# Patient Record
Sex: Male | Born: 1960 | Race: White | Hispanic: No | Marital: Married | Smoking: Never smoker
Health system: Southern US, Community
[De-identification: ages and names within clinical notes are randomized; demographics above are authoritative.]

## PROBLEM LIST (undated history)

## (undated) DIAGNOSIS — R7982 Elevated C-reactive protein (CRP): Secondary | ICD-10-CM

## (undated) DIAGNOSIS — I10 Essential (primary) hypertension: Secondary | ICD-10-CM

## (undated) DIAGNOSIS — N529 Male erectile dysfunction, unspecified: Secondary | ICD-10-CM

## (undated) DIAGNOSIS — R972 Elevated prostate specific antigen [PSA]: Secondary | ICD-10-CM

## (undated) HISTORY — DX: Male erectile dysfunction, unspecified: N52.9

## (undated) HISTORY — DX: Elevated C-reactive protein (CRP): R79.82

## (undated) HISTORY — DX: Elevated prostate specific antigen (PSA): R97.20

## (undated) HISTORY — DX: Essential (primary) hypertension: I10

## (undated) HISTORY — PX: HERNIA REPAIR: SHX51

---

## 1995-07-23 HISTORY — PX: VASECTOMY: SHX75

## 2005-09-04 ENCOUNTER — Ambulatory Visit: Payer: Self-pay | Admitting: Pulmonary Disease

## 2006-12-25 DIAGNOSIS — N400 Enlarged prostate without lower urinary tract symptoms: Secondary | ICD-10-CM | POA: Insufficient documentation

## 2008-09-09 DIAGNOSIS — I10 Essential (primary) hypertension: Secondary | ICD-10-CM | POA: Insufficient documentation

## 2008-09-18 DIAGNOSIS — R7982 Elevated C-reactive protein (CRP): Secondary | ICD-10-CM

## 2008-09-18 DIAGNOSIS — R972 Elevated prostate specific antigen [PSA]: Secondary | ICD-10-CM

## 2008-09-18 HISTORY — DX: Elevated prostate specific antigen (PSA): R97.20

## 2008-09-18 HISTORY — DX: Elevated C-reactive protein (CRP): R79.82

## 2010-05-26 ENCOUNTER — Inpatient Hospital Stay: Payer: Self-pay | Admitting: Surgery

## 2010-05-26 HISTORY — PX: APPENDECTOMY: SHX54

## 2010-05-29 LAB — PATHOLOGY REPORT

## 2010-07-22 HISTORY — PX: CHOLECYSTECTOMY: SHX55

## 2011-04-15 ENCOUNTER — Observation Stay: Payer: Self-pay | Admitting: Surgery

## 2013-03-16 LAB — HM HEPATITIS C SCREENING LAB: HM Hepatitis Screen: NEGATIVE

## 2013-07-23 ENCOUNTER — Ambulatory Visit: Payer: Self-pay | Admitting: Bariatrics

## 2013-07-23 DIAGNOSIS — I10 Essential (primary) hypertension: Secondary | ICD-10-CM

## 2013-07-23 LAB — IRON AND TIBC
Iron Bind.Cap.(Total): 302 ug/dL (ref 250–450)
Iron Saturation: 24 %
Iron: 73 ug/dL (ref 65–175)
Unbound Iron-Bind.Cap.: 229 ug/dL

## 2013-07-23 LAB — PROTIME-INR
INR: 1
PROTHROMBIN TIME: 13 s (ref 11.5–14.7)

## 2013-07-23 LAB — APTT: Activated PTT: 30.6 secs (ref 23.6–35.9)

## 2013-07-23 LAB — HEMOGLOBIN A1C: HEMOGLOBIN A1C: 5.4 % (ref 4.2–6.3)

## 2013-07-26 ENCOUNTER — Ambulatory Visit: Payer: Self-pay | Admitting: Bariatrics

## 2013-08-22 ENCOUNTER — Ambulatory Visit: Payer: Self-pay | Admitting: Bariatrics

## 2013-12-20 HISTORY — PX: OTHER SURGICAL HISTORY: SHX169

## 2013-12-30 ENCOUNTER — Ambulatory Visit: Payer: Self-pay | Admitting: Bariatrics

## 2013-12-30 LAB — POTASSIUM: Potassium: 3.6 mmol/L (ref 3.5–5.1)

## 2014-01-04 ENCOUNTER — Inpatient Hospital Stay: Payer: Self-pay | Admitting: Bariatrics

## 2014-01-04 LAB — CREATININE, SERUM
Creatinine: 0.99 mg/dL (ref 0.60–1.30)
EGFR (African American): >60

## 2014-01-05 LAB — CBC WITH DIFFERENTIAL/PLATELET
BASOS ABS: 0 10*3/uL (ref 0.0–0.1)
Basophil %: 0.1 %
EOS ABS: 0 10*3/uL (ref 0.0–0.7)
Eosinophil %: 0 %
HCT: 39.3 % — ABNORMAL LOW (ref 40.0–52.0)
HGB: 13.6 g/dL (ref 13.0–18.0)
Lymphocyte #: 1.3 10*3/uL (ref 1.0–3.6)
Lymphocyte %: 8.4 %
MCH: 28.8 pg (ref 26.0–34.0)
MCHC: 34.6 g/dL (ref 32.0–36.0)
MCV: 83 fL (ref 80–100)
Monocyte #: 0.8 x10 3/mm (ref 0.2–1.0)
Monocyte %: 5.2 %
Neutrophil #: 12.9 10*3/uL — ABNORMAL HIGH (ref 1.4–6.5)
Neutrophil %: 86.3 %
Platelet: 176 10*3/uL (ref 150–440)
RBC: 4.73 10*6/uL (ref 4.40–5.90)
RDW: 14.5 % (ref 11.5–14.5)
WBC: 15 10*3/uL — AB (ref 3.8–10.6)

## 2014-01-05 LAB — BASIC METABOLIC PANEL
ANION GAP: 9 (ref 7–16)
BUN: 9 mg/dL (ref 7–18)
CHLORIDE: 104 mmol/L (ref 98–107)
CO2: 26 mmol/L (ref 21–32)
Calcium, Total: 8.2 mg/dL — ABNORMAL LOW (ref 8.5–10.1)
Creatinine: 0.82 mg/dL (ref 0.60–1.30)
EGFR (African American): >60
EGFR (Non-African Amer.): >60
GLUCOSE: 101 mg/dL — AB (ref 65–99)
Osmolality: 276 (ref 275–301)
Potassium: 3.9 mmol/L (ref 3.5–5.1)
Sodium: 139 mmol/L (ref 136–145)

## 2014-01-05 LAB — PATHOLOGY REPORT

## 2014-03-18 LAB — LIPID PANEL
Cholesterol: 146 mg/dL (ref 0–200)
HDL: 36 mg/dL (ref 35–70)
LDL CALC: 80 mg/dL
TRIGLYCERIDES: 148 mg/dL (ref 40–160)

## 2014-03-18 LAB — BASIC METABOLIC PANEL
BUN: 11 mg/dL (ref 4–21)
Creatinine: 1 mg/dL (ref 0.6–1.3)
GLUCOSE: 93 mg/dL
Potassium: 3.9 mmol/L (ref 3.4–5.3)
SODIUM: 143 mmol/L (ref 137–147)

## 2014-06-28 LAB — HM COLONOSCOPY

## 2014-07-11 LAB — PSA: PSA: 5.7

## 2014-11-12 NOTE — Op Note (Signed)
PATIENT NAME:  John Duran, John Duran MR#:  [id number] DATE OF BIRTH:  1961/02/27  DATE OF PROCEDURE:  01/04/2014  PROCEDURE PERFORMED:  Laparoscopic sleeve gastrectomy with repair of hiatal hernia.   PREOPERATIVE DIAGNOSIS:  Long-standing morbid obesity with presence of hiatal hernia.  POSTOPERATIVE DIAGNOSIS:  Long-standing morbid obesity with presence of hiatal hernia.  SPECIMEN TYPE:  Lateral stomach.   SURGEON:  Effie ShyMichael Tyner, M.D.   ASSISTANT:  Anabel Halonon Drinkwater, PA.   ESTIMATED BLOOD LOSS:   Minimal blood loss noted intraoperatively.   PROCEDURE IN DETAIL: The patient was brought to the operating room and placed in the supine position. General anesthesia was obtained with orotracheal intubation. The abdomen, groin and perineal areas sterilely prepped and draped. TED hose and Thromboguards and a foot board also applied at the end of the operative bed prior to the drape. The patient had a 5 mm Optiview trocar introduced under direct visualization in the left upper quadrant of the abdomen. Three additional trocars were introduced along the upper abdomen. A Nathanson liver retractor introduced through a subxiphoid defect. The left lobe of the liver was elevated. A modestly prominent fat pad released from the undersurface of the left inguinal diaphragm and also the ligamentous attachments of the fundus to the undersurface of the left hemidiaphragm divided.  The patient was noted to have a moderate indentation in the region of the hiatus consistent with a hiatal hernia as noted on preoperative upper GI series. The patient had repair of this by release of the gastrohepatic ligament, followed by division of the residual peritoneum across the anterior hiatus. Blunt dissection was used to mobilize the herniated peritoneum and also the esophagus and anterior vagal nerve away from the pericardium. The patient then had division of the peritoneum just lateral to the right crural margin and blunt dissection was used to  reduce herniated lesser sac fatty tissue from the lower mediastinum. The patient then had release of residual phrenoesophageal ligaments, followed by additional circumferential dissection of the lower esophagus within the lower mediastinum. This was performed circumferentially with division of lymphovascular attachments between the esophagus and the underlying aorta, the pleural surfaces and the pericardium. This was extended into the lower mediastinum over a distance of approximately 6 cm. This resulted in delivery of 1.5 cm of esophagus lying comfortably in the abdominal cavity. Two interrupted 0 Ethibond sutures then used to close the crural musculature posteriorly. Next, the pylorus was identified and beginning approximately 4 cm proximal to the pylorus, there was division of vascular pedicles along the greater curvature of the stomach. This was extended proximally over a distance of approximately 8 cm. Next, posterior attachments of the stomach and pancreas divided by use of the Harmonic scalpel. At this point, a green load GIA stapler was used to initiate creation of a medially based gastric tube effect. The first firing was placed in a transverse direction with care taken to avoid any narrowing in the region of the incisura. A gold load staple was after this, and again, this was directed in a transverse direction avoiding the region of the incisura. This was then followed by introduction of a 5436 French ViSiGi device, which was positioned in the antral aspect of the stomach. This was then followed by a more vertical line of staples placed parallel to the lesser curvature and brought out ultimately just lateral to the angle of His. As this was done,  the ViSiGi device was partially withdrawn by trying to create a consistent tubular affect as the staples  were being positioned. The patient then had release of the lateral vascular pedicles along the upper greater curvature of the stomach with complete division of  the gastrocolic and gastrosplenic ligaments.  The lateral stomach was retrieved through the right upper quadrant 12 mm trocar site. The patient then had occlusion of the stomach just distal to staple line and insufflation with the ViSiGi device performed. A saline bath performed. No air leak identified. The ViSiGi was withdrawn. The upper aspect of the stomach, in particular near the angle of His, was affixed to the crural musculature with interrupted 0 Ethibond sutures. This was done in an effort to prevent proximal migration of the stomach back into the lower mediastinum. At this point, the 12 mm trocar was withdrawn, the fascia and peritoneum closed with a 0 Vicryl suture passed by way of a needle suture system under direct visualization.  The pneumoperitoneum relieved. The trocars were removed. The wounds were injected with 0.25% Marcaine and closed with 4-0 Monocryl in the dermis followed by Dermabond. The patient was allowed to recover having tolerated the procedure well.     ____________________________ Tyrone Apple. Alva Garnet, MD mat:dmm D: 01/04/2014 19:29:36 ET T: 01/04/2014 20:39:04 ET JOB#: [id number]  cc: Casimiro Needle A. Alva Garnet, MD, <Dictator> Everette Rank MD ELECTRONICALLY SIGNED 01/05/2014 7:27

## 2015-01-02 ENCOUNTER — Other Ambulatory Visit: Payer: Self-pay | Admitting: Family Medicine

## 2015-02-15 ENCOUNTER — Other Ambulatory Visit: Payer: Self-pay | Admitting: Family Medicine

## 2015-02-16 ENCOUNTER — Other Ambulatory Visit: Payer: Self-pay | Admitting: Family Medicine

## 2015-02-17 NOTE — Telephone Encounter (Signed)
Rx called in to pharmacy. 

## 2015-03-14 DIAGNOSIS — G4733 Obstructive sleep apnea (adult) (pediatric): Secondary | ICD-10-CM | POA: Insufficient documentation

## 2015-03-14 DIAGNOSIS — N50819 Testicular pain, unspecified: Secondary | ICD-10-CM | POA: Insufficient documentation

## 2015-03-14 DIAGNOSIS — M21249 Flexion deformity, unspecified finger joints: Secondary | ICD-10-CM | POA: Insufficient documentation

## 2015-03-14 DIAGNOSIS — N529 Male erectile dysfunction, unspecified: Secondary | ICD-10-CM | POA: Insufficient documentation

## 2015-03-14 DIAGNOSIS — K4090 Unilateral inguinal hernia, without obstruction or gangrene, not specified as recurrent: Secondary | ICD-10-CM | POA: Insufficient documentation

## 2015-03-15 ENCOUNTER — Ambulatory Visit (INDEPENDENT_AMBULATORY_CARE_PROVIDER_SITE_OTHER): Payer: Commercial Managed Care - PPO | Admitting: Family Medicine

## 2015-03-15 ENCOUNTER — Encounter: Payer: Self-pay | Admitting: Family Medicine

## 2015-03-15 VITALS — BP 114/64 | HR 65 | Temp 98.9°F | Resp 16 | Wt 223.0 lb

## 2015-03-15 DIAGNOSIS — R972 Elevated prostate specific antigen [PSA]: Secondary | ICD-10-CM

## 2015-03-15 DIAGNOSIS — I10 Essential (primary) hypertension: Secondary | ICD-10-CM

## 2015-03-15 DIAGNOSIS — E291 Testicular hypofunction: Secondary | ICD-10-CM

## 2015-03-15 NOTE — Progress Notes (Signed)
Patient: John Duran Male    DOB: [birth year]   54 y.o.   MRN: [id number] Visit Date: 03/15/2015  Today's Provider: Mila Merry, MD   Chief Complaint  Patient presents with  . Follow-up    4 month  . Hypertension  . Erectile Dysfunction  . Benign Prostatic Hypertrophy   Subjective:    HPI   Hypertension, follow-up:  BP Readings from Last 3 Encounters:  03/15/15 114/64  11/10/14 118/68    He was last seen for hypertension 4 months ago.  BP at that visit was 118/68. Management changes since that visit include; decreased  Amlodipine 5 mg  to 2.5 mg qd due to well controlled blood pressure.  He reports fair compliance with treatment. He is not having side effects. none  He is exercising. He is not adherent to low salt diet.   Outside blood pressures are none. He is experiencing none.  Patient denies none.   Cardiovascular risk factors include none.  Use of agents associated with hypertension: none.   He is maintaining his weight about 50 pounds under his pre-gastric sleeve weight.   Weight trend: stable Wt Readings from Last 3 Encounters:  03/15/15 223 lb (101.152 kg)  11/10/14 219 lb (99.338 kg)    Current diet: well balanced  ------------------------------------------------------------------------   Low testosterone  Is doing well with current dose of Androgel. He feels it is really helping with his energy level and libido. last testosterone level a year ago was normal at 557    Allergies  Allergen Reactions  . Androderm [Testosterone]     Androderm (12/25/2006   Previous Medications   AMLODIPINE (NORVASC) 2.5 MG TABLET    Take 2.5 mg by mouth daily.   ANDROGEL PUMP 20.25 MG/ACT (1.62%) GEL    APPLY 2 PUMPS EVERY DAY AS DIRECTED   CIALIS 5 MG TABLET    Take 1 tablet by mouth daily.   LISINOPRIL-HYDROCHLOROTHIAZIDE (PRINZIDE,ZESTORETIC) 10-12.5 MG PER TABLET    TAKE 1 TABLET BY MOUTH EVERY DAY   LORATADINE 10 MG CAPS    Take 1 capsule by  mouth daily.   OMEGA-3 FATTY ACIDS PO    Take 2 capsules by mouth daily.   TADALAFIL (CIALIS) 20 MG TABLET    Take 1 tablet by mouth as needed.    Review of Systems  Cardiovascular: Negative for chest pain, palpitations and leg swelling.  Neurological: Negative for dizziness, light-headedness and headaches.    Social History  Substance Use Topics  . Smoking status: Never Smoker   . Smokeless tobacco: Not on file  . Alcohol Use: No   Objective:   BP 114/64 mmHg  Pulse 65  Temp(Src) 98.9 F (37.2 C) (Oral)  Resp 16  Wt 223 lb (101.152 kg)  SpO2 97%  Physical Exam General Appearance:    Alert, cooperative, no distress, obese  Eyes:    PERRL, conjunctiva/corneas clear, EOM's intact       Lungs:     Clear to auscultation bilaterally, respirations unlabored  Heart:    Regular rate and rhythm  Neurologic:   Awake, alert, oriented x 3. No apparent focal neurological           defect.           Assessment & Plan:      1. Essential hypertension Well controlled. Will discontinue amlodipine. Advised to call if he starts seeing BPs above 140.   2. Testicular hypofunction Feels well on current dose  of androgel with no adverse effects.  - Testosterone,Free and Total  3. Abnormal prostate specific antigen Has had thorough urology evaluation. Will continue to monitor as he has no urology follow up scheduled.  - PSA       Mila Merry, MD  [facility]

## 2015-03-16 LAB — TESTOSTERONE,FREE AND TOTAL
Testosterone, Free: 11.4 pg/mL (ref 7.2–24.0)
Testosterone: 513 ng/dL (ref 348–1197)

## 2015-03-16 LAB — PSA: Prostate Specific Ag, Serum: 5.7 ng/mL — ABNORMAL HIGH (ref 0.0–4.0)

## 2015-03-19 ENCOUNTER — Other Ambulatory Visit: Payer: Self-pay | Admitting: Family Medicine

## 2015-03-20 ENCOUNTER — Encounter: Payer: Self-pay | Admitting: Family Medicine

## 2015-05-06 ENCOUNTER — Ambulatory Visit (INDEPENDENT_AMBULATORY_CARE_PROVIDER_SITE_OTHER): Payer: Commercial Managed Care - PPO

## 2015-05-06 DIAGNOSIS — Z23 Encounter for immunization: Secondary | ICD-10-CM

## 2015-05-13 IMAGING — RF DG UGI W/O KUB
9 of 10 series · 15 of 19 positions shown · non-contrast
Comparison: None.

FLUOROSCOPY TIME:  1 min 18 seconds

CLINICAL DATA: Preoperative evaluation for bariatric surgery.

EXAM:
UPPER GI SERIES WITHOUT KUB
TECHNIQUE: Routine upper GI series was performed with thin and high density
barium.

[Series 1: fluoro_barium 2fps_bw · 0.17mm/px · 2 of 2 frames shown (1 of 8)]
[frame 1/2]
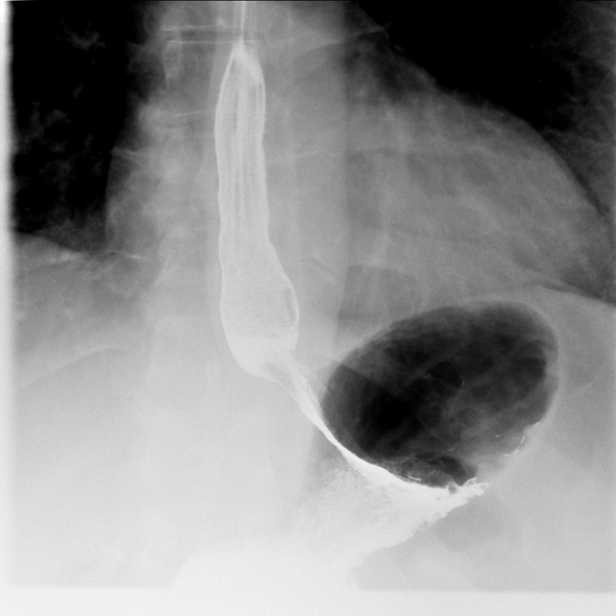
[frame 2/2]
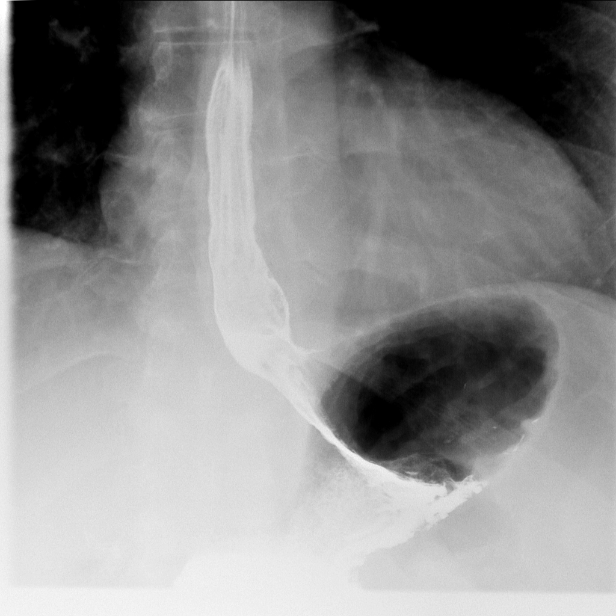

[Series 2: fluoro_barium 2fps_bw · 0.17mm/px · 1 of 2 frames shown (2 of 8)]
[frame 2/2]
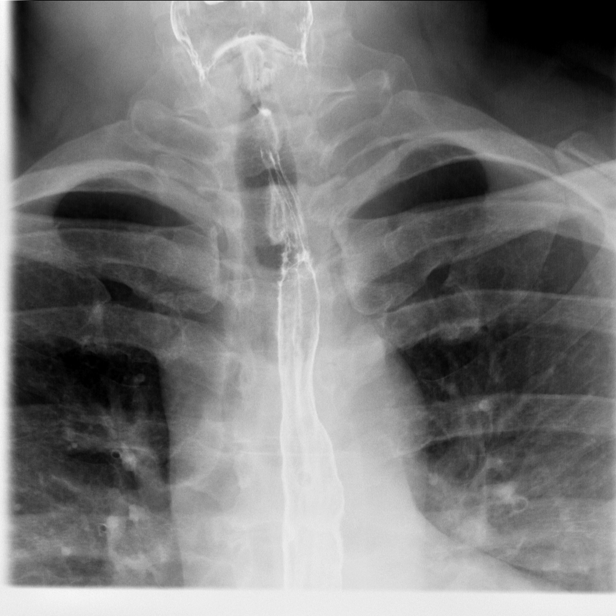

[Series 3: fluoro_barium 2fps_bw · 0.17mm/px · 2 of 2 frames shown (3 of 8)]
[frame 1/2]
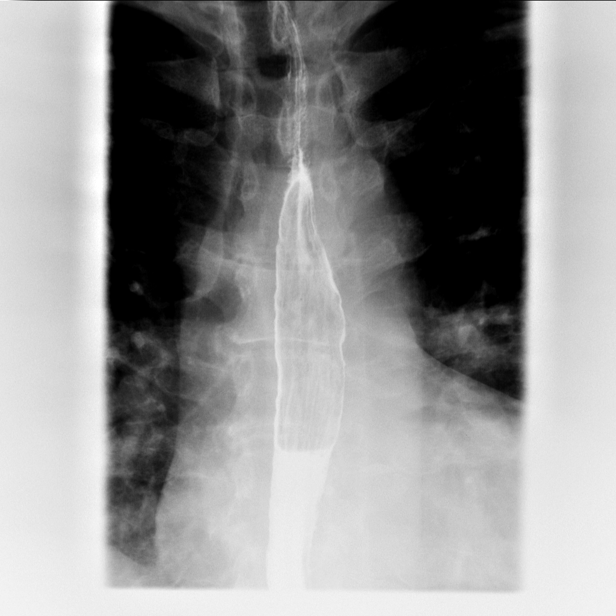
[frame 2/2]
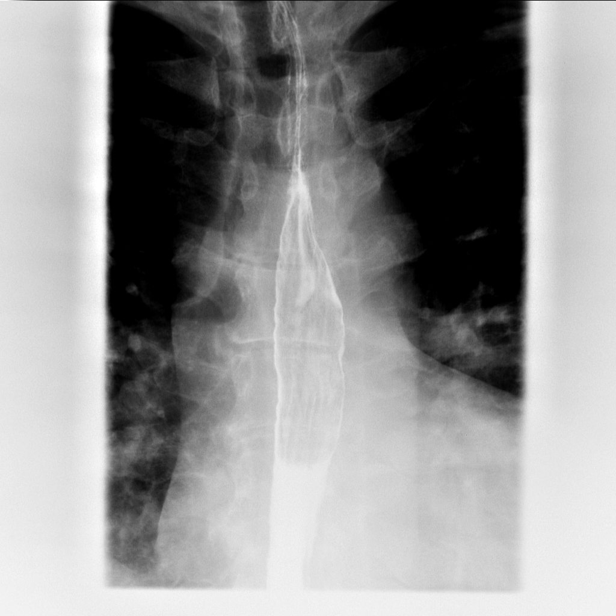

[Series 4: fluoro_barium 2fps_bw · 0.20mm/px · 2 of 3 frames shown (4 of 8)]
[frame 1/3]
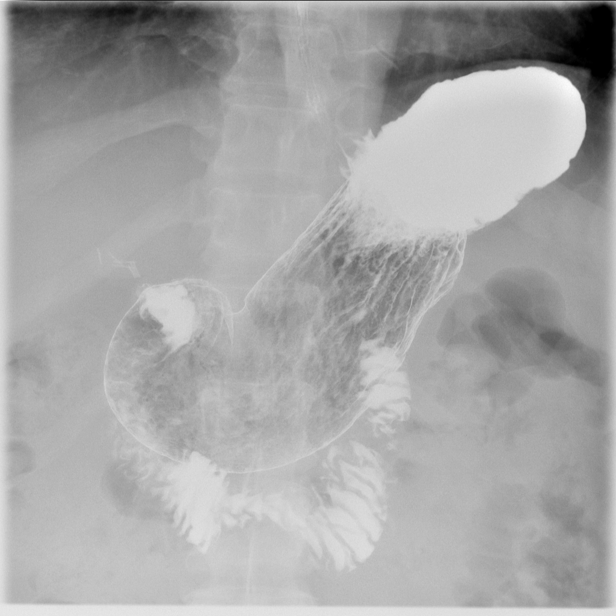
[frame 3/3]
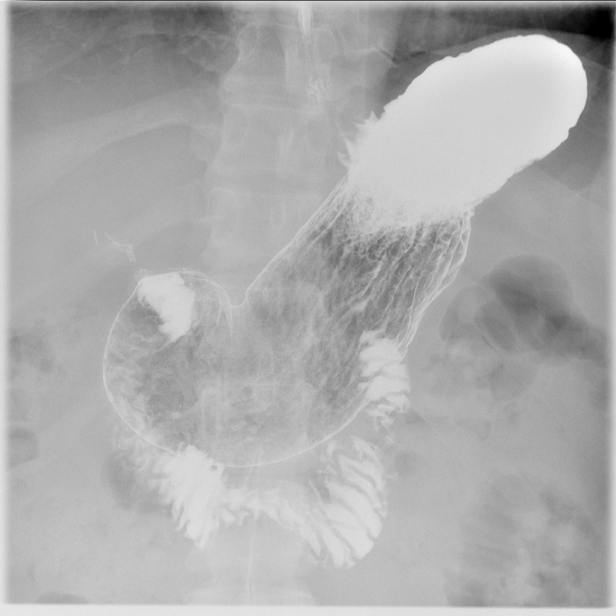

[Series 5: fluoro_barium 2fps_bw · 0.20mm/px · 1 of 1 slices shown (5 of 8)]
[im 1/1]
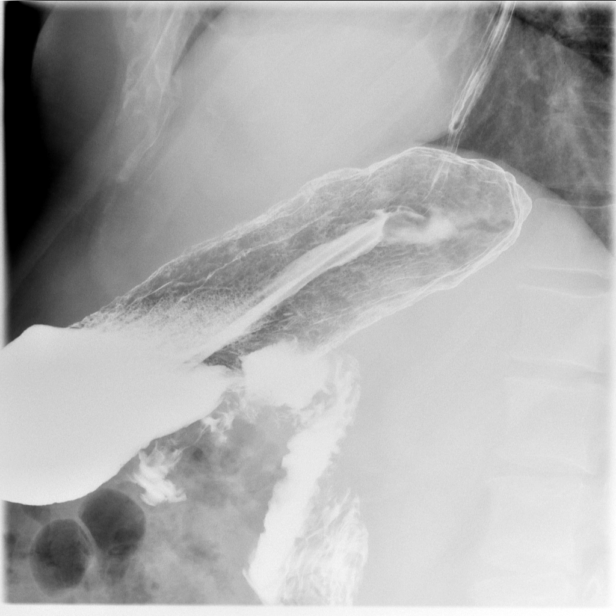

[Series 6: fluoro_barium 2fps_bw · 0.20mm/px · 1 of 1 slices shown (6 of 8)]
[im 1/1]
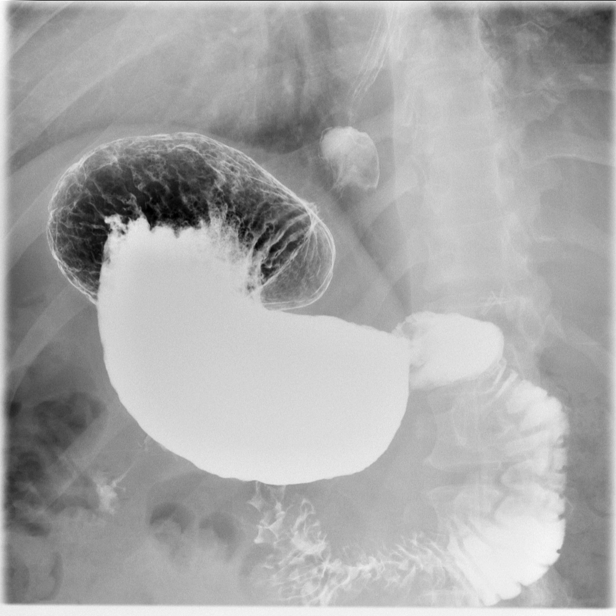

[Series 7: cp_standard · 0.20mm/px · 4 of 5 slices shown]
[im 2/5]
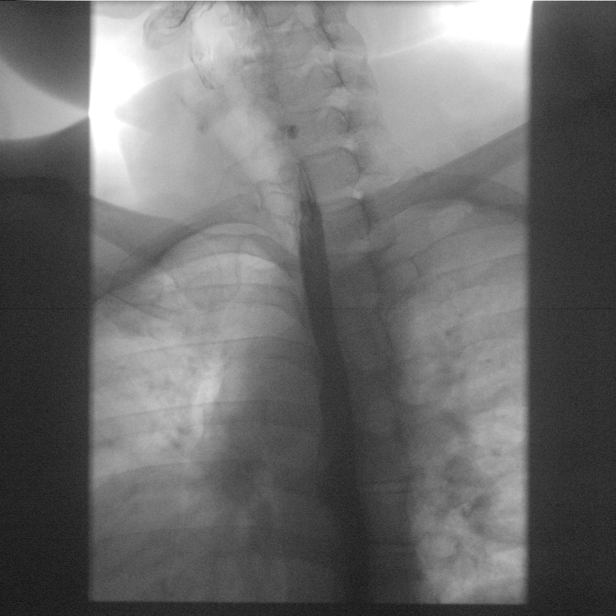
[im 3/5]
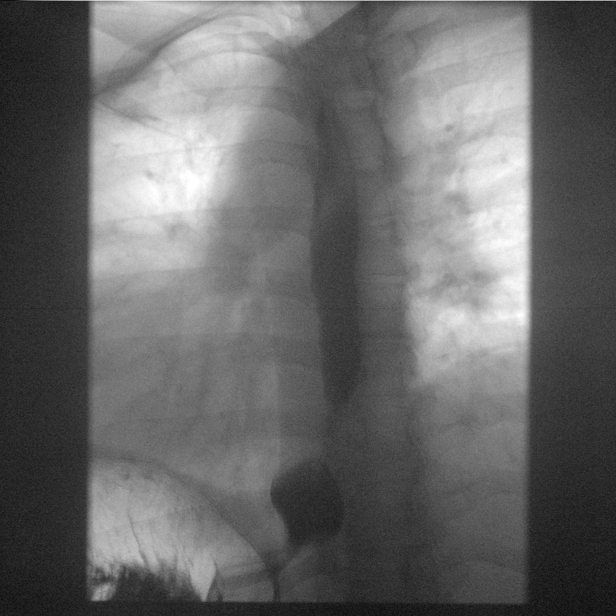
[im 4/5]
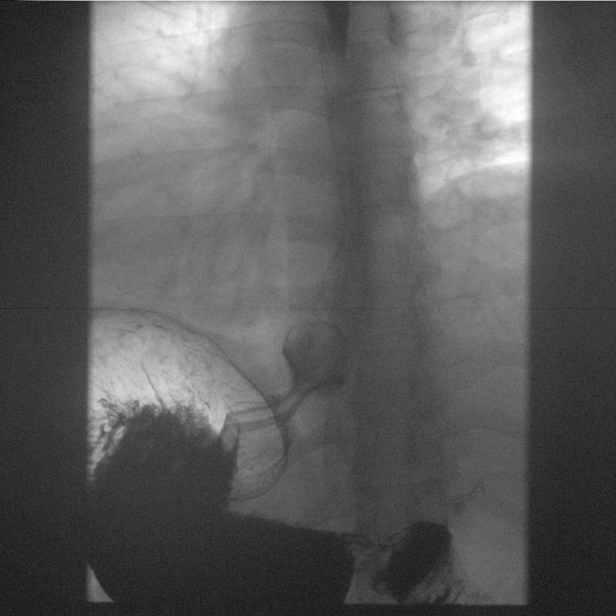
[im 5/5]
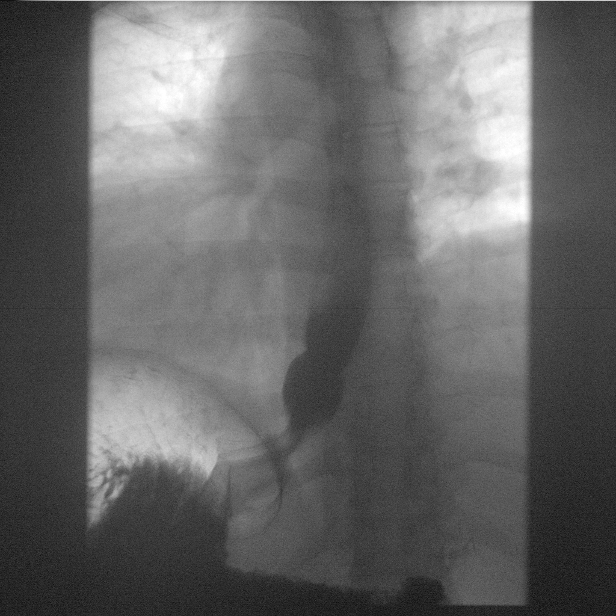

[Series 9: fluoro_barium 2fps_bw · 0.20mm/px · 1 of 1 slices shown (7 of 8)]
[im 1/1]
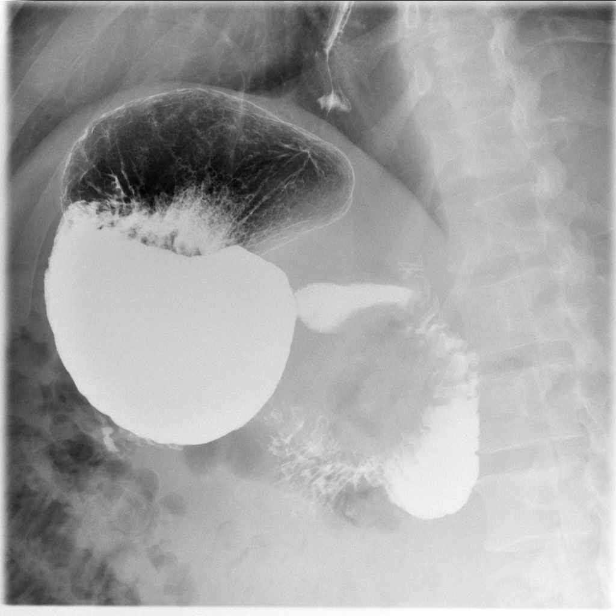

[Series 10: fluoro_barium 2fps_bw · 0.21mm/px · 1 of 1 slices shown (8 of 8)]
[im 1/1]
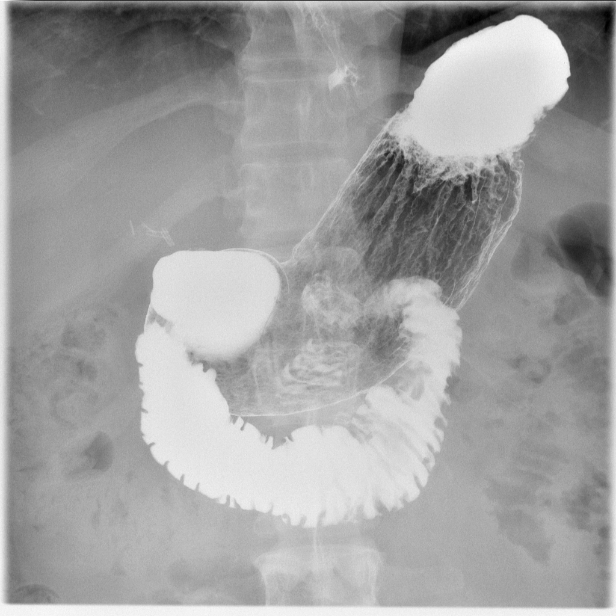

[15 of 19 positions shown; findings below may reference images not displayed]

FINDINGS: The esophagus is normal except for a very small hiatal hernia. No
evidence of stricture, mass or esophagitis. The stomach is normal.
No evidence of peptic disease or mass lesion. Do edema proximal
small bowel are normal. There has been previous cholecystectomy.
IMPRESSION: Very small hiatal hernia.  No other structural abnormality.

## 2015-05-19 ENCOUNTER — Ambulatory Visit (INDEPENDENT_AMBULATORY_CARE_PROVIDER_SITE_OTHER): Payer: Commercial Managed Care - PPO | Admitting: Family Medicine

## 2015-05-19 ENCOUNTER — Encounter: Payer: Self-pay | Admitting: Family Medicine

## 2015-05-19 ENCOUNTER — Telehealth: Payer: Self-pay

## 2015-05-19 VITALS — BP 122/66 | HR 75 | Temp 99.4°F | Resp 16 | Ht 71.0 in | Wt 221.8 lb

## 2015-05-19 DIAGNOSIS — Z116 Encounter for screening for other protozoal diseases and helminthiases: Secondary | ICD-10-CM

## 2015-05-19 DIAGNOSIS — R509 Fever, unspecified: Secondary | ICD-10-CM | POA: Diagnosis not present

## 2015-05-19 MED ORDER — ATOVAQUONE-PROGUANIL HCL 250-100 MG PO TABS
4.0000 | ORAL_TABLET | Freq: Every day | ORAL | Status: AC
Start: 2015-05-19 — End: 2015-05-19

## 2015-05-19 NOTE — Progress Notes (Signed)
Patient: John Duran Male    DOB: 17-Nov-1960   54 y.o.   MRN: [id number] Visit Date: 05/19/2015  Today's Provider: Lelon Huh, MD   Chief Complaint  Patient presents with  . Fever    x24hrs with body aches   Subjective:    HPI Patient comes in office today with concerns of fever and body ache over the past 24hrs. He returned to the [place] on 10/25, after spending 10 days in [place]. Patient states that he began running a fever of 102.9 last night along with mild headache and general malaise. He has had loose bowel movements. He did take ibuprofen this morning. Has had no other URI symptoms. No other pains. Is slightly nauseated, but no vomiting. He did get bit by a mosquita in [place], and did not take any malarial prophylaxis. He did get flu vaccine before going to [place].     Allergies  Allergen Reactions  . Androderm [Testosterone]     Androderm (12/25/2006   Previous Medications   ANDROGEL PUMP 20.25 MG/ACT (1.62%) GEL    APPLY 2 PUMPS EVERY DAY AS DIRECTED   CIALIS 5 MG TABLET    TAKE 1 TABLET BY MOUTH ONCE A DAY   LISINOPRIL-HYDROCHLOROTHIAZIDE (PRINZIDE,ZESTORETIC) 10-12.5 MG PER TABLET    TAKE 1 TABLET BY MOUTH EVERY DAY   LORATADINE 10 MG CAPS    Take 1 capsule by mouth daily.   OMEGA-3 FATTY ACIDS PO    Take 2 capsules by mouth daily.   TADALAFIL (CIALIS) 20 MG TABLET    Take 1 tablet by mouth as needed.    Review of Systems  Constitutional: Positive for fever, chills and fatigue. Negative for unexpected weight change.  HENT: Negative for congestion, ear pain, mouth sores, nosebleeds, postnasal drip, rhinorrhea, sinus pressure, sneezing and sore throat.   Eyes: Negative for pain and visual disturbance.  Respiratory: Negative for cough, chest tightness, shortness of breath and wheezing.   Cardiovascular: Negative for chest pain, palpitations and leg swelling.  Gastrointestinal: Positive for nausea. Negative for vomiting, abdominal pain, constipation  and blood in stool.  Endocrine: Negative for polydipsia and polyuria.  Genitourinary: Negative for dysuria, urgency and frequency.  Musculoskeletal: Negative for joint swelling and arthralgias.  Neurological: Negative for dizziness and light-headedness.  Psychiatric/Behavioral: Negative for behavioral problems and confusion.    Social History  Substance Use Topics  . Smoking status: Never Smoker   . Smokeless tobacco: Not on file  . Alcohol Use: No   Objective:   BP 122/66 mmHg  Pulse 75  Temp(Src) 99.4 F (37.4 C) (Oral)  Resp 16  Ht 5' 11"  (1.803 m)  Wt 221 lb 12.8 oz (100.608 kg)  BMI 30.95 kg/m2  SpO2 95%  Physical Exam  General Appearance:    Alert, cooperative, no distress  HENT:   ENT exam normal, no neck nodes or sinus tenderness  Eyes:    PERRL, conjunctiva/corneas clear, EOM's intact       Lungs:     Clear to auscultation bilaterally, respirations unlabored  Heart:    Regular rate and rhythm  Neurologic:   Awake, alert, oriented x 3. No apparent focal neurological           defect.   Lymph nodes:   No LAD of head, neck or axillae.   Abd:   Soft, non-tender, non-distended, no masses.         Assessment & Plan:     1. Fever  and chills Recently returned from area with Endemic malaria. No other clear source of infection. Cover for malaria -Malarone 250-100  4 tablets daily for 3 days.  -CBC -Met  C 2. Malaria screening -Peripheral blood smear      Lelon Huh, MD  [facility]

## 2015-05-19 NOTE — Telephone Encounter (Signed)
He can have the 1:30 appointment slot.

## 2015-05-19 NOTE — Telephone Encounter (Signed)
Wife, Bonita QuinLinda, called and states patient came back from [place] this Tuesday October 25th and yesterday he started having classic signs of malaria (she is a nurse she states) fever spikes off and on (yesterday was 101.5), chills, headache, today had diarrhea and no fever so far. She states he has had malaria before and was treated by Dr.Fisher when he came back from [place] before but she states the type of malaria in [place] is resistant strand to the typical treatment from this . She was not sure what to do at this point. Please review. Thank you-aa

## 2015-05-19 NOTE — Telephone Encounter (Signed)
Patient wife Bonita QuinLinda advised of appointment time.

## 2015-06-02 ENCOUNTER — Encounter: Payer: Self-pay | Admitting: Family Medicine

## 2015-07-15 ENCOUNTER — Other Ambulatory Visit: Payer: Self-pay | Admitting: Family Medicine

## 2015-08-04 ENCOUNTER — Other Ambulatory Visit: Payer: Self-pay | Admitting: Family Medicine

## 2015-10-03 ENCOUNTER — Other Ambulatory Visit: Payer: Self-pay | Admitting: Family Medicine

## 2015-10-04 NOTE — Telephone Encounter (Signed)
Rx called in to pharmacy. 

## 2015-10-04 NOTE — Telephone Encounter (Signed)
Please call in androgel.  

## 2015-10-30 ENCOUNTER — Telehealth: Payer: Self-pay

## 2015-10-30 DIAGNOSIS — Z1159 Encounter for screening for other viral diseases: Secondary | ICD-10-CM

## 2015-10-30 NOTE — Telephone Encounter (Signed)
Patient is traveling to [place] in July and needs proff that he has had Hep A and Hep B vaccines. I checked Allscrpts, Epic and [facility] and didn't find any documentation of these vaccines. Patient states he thinks he may have had then in the late 90's. Patient wants to know if you will order a titer to see if he has had these vaccines?

## 2015-10-30 NOTE — Telephone Encounter (Signed)
Patient advised that lab slip is ready for pick up. Requisition left up front for pick up.

## 2015-10-30 NOTE — Telephone Encounter (Signed)
Order has been printed. Please leave at front desk.

## 2015-12-23 ENCOUNTER — Other Ambulatory Visit: Payer: Self-pay | Admitting: Family Medicine

## 2016-02-23 ENCOUNTER — Other Ambulatory Visit: Payer: Self-pay | Admitting: Family Medicine

## 2016-02-23 DIAGNOSIS — I10 Essential (primary) hypertension: Secondary | ICD-10-CM

## 2016-04-24 ENCOUNTER — Ambulatory Visit (INDEPENDENT_AMBULATORY_CARE_PROVIDER_SITE_OTHER): Payer: Commercial Managed Care - PPO | Admitting: Family Medicine

## 2016-04-24 ENCOUNTER — Encounter: Payer: Self-pay | Admitting: Family Medicine

## 2016-04-24 VITALS — BP 148/90 | HR 64 | Temp 98.1°F | Resp 16 | Wt 236.0 lb

## 2016-04-24 DIAGNOSIS — E291 Testicular hypofunction: Secondary | ICD-10-CM | POA: Diagnosis not present

## 2016-04-24 DIAGNOSIS — F32A Depression, unspecified: Secondary | ICD-10-CM

## 2016-04-24 DIAGNOSIS — Z125 Encounter for screening for malignant neoplasm of prostate: Secondary | ICD-10-CM | POA: Diagnosis not present

## 2016-04-24 DIAGNOSIS — G4733 Obstructive sleep apnea (adult) (pediatric): Secondary | ICD-10-CM

## 2016-04-24 DIAGNOSIS — I10 Essential (primary) hypertension: Secondary | ICD-10-CM

## 2016-04-24 DIAGNOSIS — S39012A Strain of muscle, fascia and tendon of lower back, initial encounter: Secondary | ICD-10-CM

## 2016-04-24 DIAGNOSIS — F329 Major depressive disorder, single episode, unspecified: Secondary | ICD-10-CM

## 2016-04-24 MED ORDER — CYCLOBENZAPRINE HCL 5 MG PO TABS: 5.0000 mg | ORAL_TABLET | Freq: Three times a day (TID) | ORAL | 1 refills | Status: DC | PRN

## 2016-04-24 MED ORDER — FLUOXETINE HCL 20 MG PO CAPS: 20.0000 mg | ORAL_CAPSULE | Freq: Every day | ORAL | 1 refills | Status: DC

## 2016-04-24 MED ORDER — LISINOPRIL-HYDROCHLOROTHIAZIDE 20-12.5 MG PO TABS: 1.0000 | ORAL_TABLET | Freq: Every day | ORAL | 1 refills | Status: DC

## 2016-04-24 NOTE — Patient Instructions (Addendum)
   You can take OTC ibuprofen 200mg , 4 tablets three times a day for the next 2-3 days   Apply ice to affected area every 2-3 hours for the next 2 days, then alternate heat and ice.    Muscle Strain A muscle strain (pulled muscle) happens when a muscle is stretched beyond normal length. It happens when a sudden, violent force stretches your muscle too far. Usually, a few of the fibers in your muscle are torn. Muscle strain is common in athletes. Recovery usually takes 1-2 weeks. Complete healing takes 5-6 weeks.  HOME CARE   Follow the PRICE method of treatment to help your injury get better. Do this the first 2-3 days after the injury:  Protect. Protect the muscle to keep it from getting injured again.  Rest. Limit your activity and rest the injured body part.  Ice. Put ice in a plastic bag. Place a towel between your skin and the bag. Then, apply the ice and leave it on from 15-20 minutes each hour. After the third day, switch to moist heat packs.  Compression. Use a splint or elastic bandage on the injured area for comfort. Do not put it on too tightly.  Elevate. Keep the injured body part above the level of your heart.  Only take medicine as told by your doctor.  Warm up before doing exercise to prevent future muscle strains. GET HELP IF:   You have more pain or puffiness (swelling) in the injured area.  You feel numbness, tingling, or notice a loss of strength in the injured area. MAKE SURE YOU:   Understand these instructions.  Will watch your condition.  Will get help right away if you are not doing well or get worse.   This information is not intended to replace advice given to you by your health care provider. Make sure you discuss any questions you have with your health care provider.   Document Released: 04/16/2008 Document Revised: 04/28/2013 Document Reviewed: 02/04/2013 Elsevier Interactive Patient Education [employer]2016 Elsevier Inc.

## 2016-04-24 NOTE — Progress Notes (Signed)
Patient: John Duran Male    DOB: 03-03-1961   55 y.o.   MRN: [id number] Visit Date: 04/24/2016  Today's Provider: Mila Merry, MD   Chief Complaint  Patient presents with  . Shoulder Pain  . Back Pain   Subjective:    Shoulder Pain   The pain is present in the left shoulder. This is a new problem. 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 The problem occurs constantly. The problem has been unchanged. Quality: sharp pulling. The pain is moderate. Associated symptoms include a limited range of motion (in left arm). Pertinent negatives include no fever, joint swelling, numbness, stiffness or tingling. The symptoms are aggravated by lying down. He has tried NSAIDS for the symptoms. The treatment provided mild relief.  Back Pain  This is a new problem. The current episode started yesterday. The problem occurs constantly. The problem is unchanged. Pertinent negatives include no abdominal pain, chest pain, fever, numbness or tingling. He has tried NSAIDs for the symptoms. The treatment provided mild relief.   Used heat last night and pain was worse this morning   Depression He reports having bouts of depression intermittently all of his life. Mainly in the form of having no motivation or energy in episodes that last several weeks. States there is strong family history of depression in his family, including bipolar disorder in his mother. His son has recently been diagnosed with depression and is seeing counselor and taking Lexapro. Patient denies having any manic symptoms or thoughts of harming self or others. Episodes have been more frequent and longer lasting the last few years and he feels it is probably time to starting taking an antidepressant.   Follow up low testosterone He is overdue for follow up labs. He is using Androgel consistent with no side effects, and feels like his energy level has greatly improved since starting  medications.   Hypertension follow up Has been off of amlodipine since last year, and BP had been pretty good, but the last few months SBP has been consistently in the 140s and 150s.     Allergies  Allergen Reactions  . Androderm [Testosterone]     Androderm (12/25/2006     Current Outpatient Prescriptions:  .  ANDROGEL PUMP 20.25 MG/ACT (1.62%) GEL, APPLY 2 PUMPS EVERY DAY AS DIRECTED, Disp: 75 g, Rfl: 5 .  CIALIS 5 MG tablet, TAKE 1 TABLET BY MOUTH ONCE A DAY, Disp: 30 tablet, Rfl: 6 .  lisinopril-hydrochlorothiazide (PRINZIDE,ZESTORETIC) 10-12.5 MG tablet, TAKE 1 TABLET BY MOUTH EVERY DAY, Disp: 30 tablet, Rfl: 5 .  Loratadine 10 MG CAPS, Take 1 capsule by mouth daily., Disp: , Rfl:  .  OMEGA-3 FATTY ACIDS PO, Take 2 capsules by mouth daily., Disp: , Rfl:  .  amLODipine (NORVASC) 2.5 MG tablet, TAKE 1 TABLET BY MOUTH DAILY (Patient not taking: Reported on 04/24/2016), Disp: 30 tablet, Rfl: 12  Review of Systems  Constitutional: Negative for appetite change, chills and fever.  Respiratory: Negative for chest tightness, shortness of breath and wheezing.   Cardiovascular: Negative for chest pain and palpitations.  Gastrointestinal: Negative for abdominal pain, nausea and vomiting.  Musculoskeletal: Positive for arthralgias, back pain and myalgias. Negative for stiffness.  Neurological: Negative for dizziness, tingling and numbness.    Social History  Substance Use Topics  . Smoking status: Never Smoker  . Smokeless tobacco: Never Used  . Alcohol use No   Objective:   BP Marland Kitchen)  148/90 (BP Location: Left Arm, Patient Position: Sitting, Cuff Size: Large)   Pulse 64   Temp 98.1 F (36.7 C) (Oral)   Resp 16   Wt 236 lb (107 kg)   SpO2 98% Comment: room air  BMI 32.92 kg/m   Physical Exam   General Appearance:    Alert, cooperative, no distress  Eyes:    PERRL, conjunctiva/corneas clear, EOM's intact       Lungs:     Clear to auscultation bilaterally, respirations unlabored    Heart:    Regular rate and rhythm  Neurologic:   Awake, alert, oriented x 3. No apparent focal neurological           defect.   MS:   Area of moderate tenderness left parathoracic and paralumbar muscles. No spine tenderness.        Assessment & Plan:     1. Back strain, initial encounter  - cyclobenzaprine (FLEXERIL) 5 MG tablet; Take 1-2 tablets (5-10 mg total) by mouth 3 (three) times daily as needed for muscle spasms.  Dispense: 20 tablet; Refill: 1  2. Essential hypertension BP up the last few months. Will increase lisinopril-hctz from 10-12.5 - lisinopril-hydrochlorothiazide (ZESTORETIC) 20-12.5 MG tablet; Take 1 tablet by mouth daily.  Dispense: 30 tablet; Refill: 1 - Renal function panel  3. Obstructive sleep apnea Doing well with current CPAP which is using consistently and with significant improvement of sleepiness and enerty.   4. Depression, unspecified depression type Newly diagnosed, but likely lifelong. Start SSRI. Counseled risks/side effects and potential benefits.  - FLUoxetine (PROZAC) 20 MG capsule; Take 1 capsule (20 mg total) by mouth daily.  Dispense: 30 capsule; Refill: 1  5. Testicular hypofunction Doing well on Androgel.  - CBC - Testosterone,Free and Total - PSA  6. Prostate cancer screening  - PSA     Addressed extensive list of chronic and acute medical problems today requiring extensive time in counseling and coordination care.  Over half of this 45 minute visit were spent in counseling and coordinating care of multiple medical problems.   Mila Merryonald Desman Polak, MD  [facility]

## 2016-04-26 LAB — RENAL FUNCTION PANEL
ALBUMIN: 4.4 g/dL (ref 3.5–5.5)
BUN/Creatinine Ratio: 15 (ref 9–20)
BUN: 13 mg/dL (ref 6–24)
CO2: 28 mmol/L (ref 18–29)
CREATININE: 0.89 mg/dL (ref 0.76–1.27)
Calcium: 8.9 mg/dL (ref 8.7–10.2)
Chloride: 101 mmol/L (ref 96–106)
GFR calc Af Amer: 114 mL/min/{1.73_m2} (ref >59)
GFR, EST NON AFRICAN AMERICAN: 98 mL/min/{1.73_m2} (ref >59)
Glucose: 101 mg/dL — ABNORMAL HIGH (ref 65–99)
PHOSPHORUS: 3.3 mg/dL (ref 2.5–4.5)
Potassium: 3.7 mmol/L (ref 3.5–5.2)
Sodium: 142 mmol/L (ref 134–144)

## 2016-04-26 LAB — CBC
Hematocrit: 39.4 % (ref 37.5–51.0)
Hemoglobin: 13.6 g/dL (ref 12.6–17.7)
MCH: 29.3 pg (ref 26.6–33.0)
MCHC: 34.5 g/dL (ref 31.5–35.7)
MCV: 85 fL (ref 79–97)
PLATELETS: 159 10*3/uL (ref 150–379)
RBC: 4.64 x10E6/uL (ref 4.14–5.80)
RDW: 13.9 % (ref 12.3–15.4)
WBC: 8.4 10*3/uL (ref 3.4–10.8)

## 2016-04-26 LAB — TESTOSTERONE,FREE AND TOTAL
Testosterone, Free: 8.9 pg/mL (ref 7.2–24.0)
Testosterone: 481 ng/dL (ref 264–916)

## 2016-04-26 LAB — PSA: PROSTATE SPECIFIC AG, SERUM: 4.4 ng/mL — AB (ref 0.0–4.0)

## 2016-05-27 ENCOUNTER — Ambulatory Visit: Payer: Self-pay | Admitting: Family Medicine

## 2016-06-05 ENCOUNTER — Ambulatory Visit: Payer: Commercial Managed Care - PPO | Admitting: Family Medicine

## 2016-06-17 ENCOUNTER — Other Ambulatory Visit: Payer: Self-pay | Admitting: Family Medicine

## 2016-06-17 DIAGNOSIS — F32A Depression, unspecified: Secondary | ICD-10-CM

## 2016-06-17 DIAGNOSIS — I10 Essential (primary) hypertension: Secondary | ICD-10-CM

## 2016-06-17 DIAGNOSIS — F329 Major depressive disorder, single episode, unspecified: Secondary | ICD-10-CM

## 2016-08-21 ENCOUNTER — Other Ambulatory Visit: Payer: Self-pay | Admitting: Family Medicine

## 2016-08-25 NOTE — Telephone Encounter (Signed)
Please call in androgel.  

## 2016-08-26 NOTE — Telephone Encounter (Signed)
Rx called in to pharmacy. 

## 2016-09-05 ENCOUNTER — Telehealth: Payer: Self-pay | Admitting: Family Medicine

## 2016-09-05 NOTE — Telephone Encounter (Signed)
Pt's wife called regarding   CIALIS 5 MG tablet  Taking 12/24/15 -- Malva Limesonald E Fisher, MD   TAKE 1 TABLET BY MOUTH      His insurance needs clinical reason to why he needs daily use.  Insurance number is [phone]  Thanks Barth Kirkseri

## 2016-09-05 NOTE — Telephone Encounter (Signed)
Patient has (BPH) benign prostatic hypertrophy and ED. Failed treatment with tamsulosin.

## 2016-09-05 NOTE — Telephone Encounter (Signed)
Please advise 

## 2016-09-09 NOTE — Telephone Encounter (Signed)
Called [facility]. PA for Cialis was approved 08/02/16. There was a problem with quantity that they were able to fix. [facility] stated that pt had already picked-up 8 tablets so he can go ahead get 22 more this month. Patient will be able to get rx for 30 per 30 next month. Patient was notified.

## 2016-09-10 ENCOUNTER — Other Ambulatory Visit: Payer: Self-pay | Admitting: Family Medicine

## 2016-09-10 DIAGNOSIS — S39012A Strain of muscle, fascia and tendon of lower back, initial encounter: Secondary | ICD-10-CM

## 2016-10-11 ENCOUNTER — Other Ambulatory Visit: Payer: Self-pay | Admitting: Family Medicine

## 2017-06-06 ENCOUNTER — Ambulatory Visit: Payer: Commercial Managed Care - PPO | Admitting: Family Medicine

## 2017-06-06 ENCOUNTER — Encounter: Payer: Self-pay | Admitting: Family Medicine

## 2017-06-06 VITALS — BP 120/64 | HR 67 | Resp 16 | Wt 249.0 lb

## 2017-06-06 DIAGNOSIS — I10 Essential (primary) hypertension: Secondary | ICD-10-CM

## 2017-06-06 DIAGNOSIS — E291 Testicular hypofunction: Secondary | ICD-10-CM

## 2017-06-06 DIAGNOSIS — F329 Major depressive disorder, single episode, unspecified: Secondary | ICD-10-CM

## 2017-06-06 DIAGNOSIS — F32A Depression, unspecified: Secondary | ICD-10-CM

## 2017-06-06 DIAGNOSIS — N529 Male erectile dysfunction, unspecified: Secondary | ICD-10-CM

## 2017-06-06 DIAGNOSIS — G4709 Other insomnia: Secondary | ICD-10-CM | POA: Diagnosis not present

## 2017-06-06 MED ORDER — FLUOXETINE HCL 20 MG PO CAPS: 20.0000 mg | ORAL_CAPSULE | Freq: Every day | ORAL | 3 refills | Status: DC

## 2017-06-06 MED ORDER — TADALAFIL 5 MG PO TABS: 5.0000 mg | ORAL_TABLET | Freq: Every day | ORAL | 3 refills | Status: DC | PRN

## 2017-06-06 MED ORDER — LISINOPRIL-HYDROCHLOROTHIAZIDE 20-12.5 MG PO TABS: 1.0000 | ORAL_TABLET | Freq: Every day | ORAL | 3 refills | Status: DC

## 2017-06-06 MED ORDER — FLUOXETINE HCL 20 MG PO CAPS: 20.0000 mg | ORAL_CAPSULE | Freq: Every day | ORAL | 0 refills | Status: DC

## 2017-06-06 MED ORDER — TADALAFIL 5 MG PO TABS: 5.0000 mg | ORAL_TABLET | Freq: Every day | ORAL | 0 refills | Status: DC | PRN

## 2017-06-06 MED ORDER — ZOLPIDEM TARTRATE 10 MG PO TABS: 5.0000 mg | ORAL_TABLET | Freq: Every evening | ORAL | 3 refills | Status: DC | PRN

## 2017-06-06 MED ORDER — LISINOPRIL-HYDROCHLOROTHIAZIDE 20-12.5 MG PO TABS: 1.0000 | ORAL_TABLET | Freq: Every day | ORAL | 0 refills | Status: DC

## 2017-06-06 NOTE — Progress Notes (Signed)
Patient: John MiyamotoScott A Booz Male    DOB: Nov 30, 1960   56 y.o.   MRN: [id number] Visit Date: 06/06/2017  Today's Provider: Mila Merryonald Fisher, MD   Chief Complaint  Patient presents with  . Follow-up  . Hypertension   Subjective:    HPI   Hypertension, follow-up:  BP Readings from Last 3 Encounters:  06/06/17 120/64  04/24/16 (!) 148/90  05/19/15 122/66    He was last seen for hypertension 1 years ago.  BP at that visit was 148/90. Management since that visit includes; increased lisinopril-HCTZ from 10-12.5 mg to 20-12.5 mg qd.He reports good compliance with treatment. He is not having side effects. none He is exercising. He is adherent to low salt diet.   Outside blood pressures are not checking. He is experiencing none.  Patient denies none.   Cardiovascular risk factors include none.  Use of agents associated with hypertension: none.  ----------------------------------------------------------------   Depression, unspecified depression type From 04/24/2016-Started SSRI FLUoxetine (PROZAC) 20 MG capsule. He feels fluoxetine has helped quite a bit and wishes to continue, although he thinks it makes him a bit fatigued.   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   He also requests something to take periodically to help him sleep. Takes melatonin nightly.   Finally he requests 90 day prescription for all of his medications including generic for ED medications. He states his insurance coverers generic tadalafil. [place]'s [place], and will be doing his job remotely. [place] 3 [place].   Allergies  Allergen Reactions  . Androderm [Testosterone]     Androderm (12/25/2006     Current  Outpatient Medications:  .  CIALIS 5 MG tablet, TAKE 1 TABLET BY MOUTH ONCE A DAY, Disp: 30 tablet, Rfl: 6 .  FLUoxetine (PROZAC) 20 MG capsule, TAKE 1 CAPSULE (20 MG TOTAL) BY MOUTH DAILY., Disp: 30 capsule, Rfl: 12 .  lisinopril-hydrochlorothiazide (PRINZIDE,ZESTORETIC) 20-12.5 MG tablet, TAKE 1 TABLET BY MOUTH DAILY., Disp: 30 tablet, Rfl: 12 .  Loratadine 10 MG CAPS, Take 1 capsule by mouth daily., Disp: , Rfl:  .  OMEGA-3 FATTY ACIDS PO, Take 2 capsules by mouth daily., Disp: , Rfl:  .  ANDROGEL PUMP 20.25 MG/ACT (1.62%) GEL, APPLY 2 PUMPS TOPICALLY EVERY DAY AS DIRECTED (Patient not taking: Reported on 06/06/2017), Disp: 75 g, Rfl: 5 .  cyclobenzaprine (FLEXERIL) 5 MG tablet, Take 1-2 tablets (5-10 mg total) by mouth 3 (three) times daily as needed for muscle spasms. (Patient not taking: Reported on 06/06/2017), Disp: 20 tablet, Rfl: 3  Review of Systems  Constitutional: Negative for appetite change, chills and fever.  Respiratory: Negative for chest tightness, shortness of breath and wheezing.   Cardiovascular: Negative for chest pain and palpitations.  Gastrointestinal: Negative for abdominal pain, nausea and vomiting.    Social History   Tobacco Use  . Smoking status: Never Smoker  . Smokeless tobacco: Never Used  Substance Use Topics  . Alcohol use: No   Objective:   BP 120/64 (BP Location: Right Arm, Patient Position: Sitting, Cuff Size: Large)   Pulse 67   Resp 16   Wt 249 lb (112.9 kg)   SpO2 98%   BMI 34.73  kg/m  Vitals:   06/06/17 1102  BP: 120/64  Pulse: 67  Resp: 16  SpO2: 98%  Weight: 249 lb (112.9 kg)     Physical Exam  General Appearance:    Alert, cooperative, no distress, obese  Eyes:    PERRL, conjunctiva/corneas clear, EOM's intact       Lungs:     Clear to auscultation bilaterally, respirations unlabored  Heart:    Regular rate and rhythm  Neurologic:   Awake, alert, oriented x 3. No apparent focal neurological           defect.             Assessment & Plan:     1. Essential hypertension Well controlled.  Continue current medications.   - lisinopril-hydrochlorothiazide (PRINZIDE,ZESTORETIC) 20-12.5 MG tablet; Take 1 tablet daily by mouth.  Dispense: 90 tablet; Refill: 3  2. Depression, unspecified depression type Doing much better since starting fluoxetine.   - FLUoxetine (PROZAC) 20 MG capsule; Take 1 capsule (20 mg total) daily by mouth.  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  Dispense: 90 tablet; Refill: 3  4. Testicular hypofunction Currently off testosterone and he reports he feels he is doing fine  5. Other insomnia Try - zolpidem (AMBIEN) 10 MG tablet; Take 0.5-1 tablets (5-10 mg total) at bedtime as needed by mouth for sleep.  Dispense: 20 tablet; Refill: 3       Mila Merryonald Fisher, MD  [facility]

## 2017-07-07 ENCOUNTER — Other Ambulatory Visit: Payer: Self-pay | Admitting: Family Medicine

## 2017-07-07 DIAGNOSIS — G4709 Other insomnia: Secondary | ICD-10-CM

## 2017-07-07 DIAGNOSIS — F32A Depression, unspecified: Secondary | ICD-10-CM

## 2017-07-07 DIAGNOSIS — F329 Major depressive disorder, single episode, unspecified: Secondary | ICD-10-CM

## 2017-07-07 DIAGNOSIS — I10 Essential (primary) hypertension: Secondary | ICD-10-CM

## 2017-07-07 MED ORDER — LISINOPRIL-HYDROCHLOROTHIAZIDE 20-12.5 MG PO TABS: 1.0000 | ORAL_TABLET | Freq: Every day | ORAL | 3 refills | Status: DC

## 2017-07-07 MED ORDER — ZOLPIDEM TARTRATE 10 MG PO TABS: 5.0000 mg | ORAL_TABLET | Freq: Every evening | ORAL | 1 refills | Status: DC | PRN

## 2017-07-07 MED ORDER — FLUOXETINE HCL 20 MG PO CAPS: 20.0000 mg | ORAL_CAPSULE | Freq: Every day | ORAL | 3 refills | Status: DC

## 2017-07-07 MED ORDER — TADALAFIL 5 MG PO TABS: 5.0000 mg | ORAL_TABLET | Freq: Every day | ORAL | 3 refills | Status: DC | PRN

## 2017-07-07 NOTE — Telephone Encounter (Signed)
[facility] faxed refill request for the following medications:  1. zolpidem (AMBIEN) 10 MG tablet 2. lisinopril-hydrochlorothiazide (PRINZIDE,ZESTORETIC) 20-12.5 MG tablet 3. tadalafil (CIALIS) 5 MG tablet 4. FLUoxetine (PROZAC) 20 MG capsule  90 day supply  Please advise. Thanks TNP

## 2017-07-07 NOTE — Telephone Encounter (Signed)
Please review. Thanks!  

## 2017-07-08 ENCOUNTER — Telehealth: Payer: Self-pay | Admitting: Family Medicine

## 2017-07-08 NOTE — Telephone Encounter (Signed)
Error/MW °

## 2017-07-14 ENCOUNTER — Ambulatory Visit: Payer: Commercial Managed Care - PPO | Admitting: Family Medicine

## 2017-07-14 ENCOUNTER — Other Ambulatory Visit: Payer: Self-pay | Admitting: Family Medicine

## 2017-07-14 DIAGNOSIS — I10 Essential (primary) hypertension: Secondary | ICD-10-CM

## 2017-07-14 DIAGNOSIS — G4709 Other insomnia: Secondary | ICD-10-CM

## 2017-07-14 DIAGNOSIS — F32A Depression, unspecified: Secondary | ICD-10-CM

## 2017-07-14 DIAGNOSIS — F329 Major depressive disorder, single episode, unspecified: Secondary | ICD-10-CM

## 2017-07-14 MED ORDER — FLUOXETINE HCL 20 MG PO CAPS: 20.0000 mg | ORAL_CAPSULE | Freq: Every day | ORAL | 3 refills | Status: DC

## 2017-07-14 MED ORDER — LISINOPRIL-HYDROCHLOROTHIAZIDE 20-12.5 MG PO TABS
1.0000 | ORAL_TABLET | Freq: Every day | ORAL | 3 refills | Status: DC
Start: 2017-07-14 — End: 2018-07-07

## 2017-07-14 MED ORDER — ZOLPIDEM TARTRATE 10 MG PO TABS: 5.0000 mg | ORAL_TABLET | Freq: Every evening | ORAL | 1 refills | Status: DC | PRN

## 2017-07-14 MED ORDER — TADALAFIL 5 MG PO TABS: 5.0000 mg | ORAL_TABLET | Freq: Every day | ORAL | 3 refills | Status: DC | PRN

## 2017-07-14 NOTE — Telephone Encounter (Signed)
[facility] faxed refill request for the following medications:  1. zolpidem (AMBIEN) 10 MG tablet (pharmacy didn't request a specific supply/quantity for the above medication) 2. FLUoxetine (PROZAC) 20 MG capsule 3. tadalafil (CIALIS) 5 MG tablet 4. lisinopril-hydrochlorothiazide (PRINZIDE,ZESTORETIC) 20-12.5 MG tablet   90 day supply for # 2-4  All medications were recently sent to local pharmacy on 07/07/17. LOV: 06/06/17 Please advise. Thanks TNP

## 2017-07-14 NOTE — Telephone Encounter (Signed)
Please review

## 2017-07-14 NOTE — Telephone Encounter (Signed)
[facility] faxed refill request for the following medications:  1. zolpidem (AMBIEN) 10 MG tablet  2. FLUoxetine (PROZAC) 20 MG capsule 90 day supply   Last Rx: Rx for both medications were sent local pharmacy on 07/07/17 the FLUoxetine (PROZAC) 20 MG capsule was 90 day supply with 3 refills and the zolpidem (AMBIEN) 10 MG tablet was 60 tablet with 1 refill.  LOV: 06/06/17 Please advise. Thanks TNP

## 2017-07-14 NOTE — Telephone Encounter (Signed)
Please check with patient and verify that he wan't to get all his meds from mail order pharmacy.

## 2017-07-14 NOTE — Telephone Encounter (Signed)
[facility] faxed refill request for the following medications:  zolpidem (AMBIEN) 10 MG tablet   Last Rx: 07/07/17 with 1 refill was sent to [facility] [address] LOV: 06/06/17 Please advise. Thanks TNP

## 2017-07-16 ENCOUNTER — Other Ambulatory Visit: Payer: Self-pay

## 2017-07-16 DIAGNOSIS — F32A Depression, unspecified: Secondary | ICD-10-CM

## 2017-07-16 DIAGNOSIS — F329 Major depressive disorder, single episode, unspecified: Secondary | ICD-10-CM

## 2017-07-16 MED ORDER — TADALAFIL 5 MG PO TABS: 5.0000 mg | ORAL_TABLET | Freq: Every day | ORAL | 3 refills | Status: AC | PRN

## 2017-07-17 ENCOUNTER — Other Ambulatory Visit: Payer: Self-pay | Admitting: Family Medicine

## 2017-07-17 DIAGNOSIS — G4709 Other insomnia: Secondary | ICD-10-CM

## 2017-07-17 NOTE — Telephone Encounter (Signed)
[facility] request for a 90-days supply for the following medication. Thanks CC  zolpidem (AMBIEN) 10 MG tablet

## 2017-07-18 MED ORDER — ZOLPIDEM TARTRATE 10 MG PO TABS: 5.0000 mg | ORAL_TABLET | Freq: Every evening | ORAL | 3 refills | Status: DC | PRN

## 2017-08-19 ENCOUNTER — Encounter: Payer: Self-pay | Admitting: Family Medicine

## 2017-08-21 ENCOUNTER — Encounter: Payer: Self-pay | Admitting: Family Medicine

## 2017-08-23 NOTE — Telephone Encounter (Signed)
I think this patient is trying to get medical records. It looks they need an e-mail to  Send consent to release medication information. I'm not sure who to forward this to make it happen.

## 2017-11-17 ENCOUNTER — Other Ambulatory Visit: Payer: Self-pay | Admitting: Family Medicine

## 2017-11-17 DIAGNOSIS — G4709 Other insomnia: Secondary | ICD-10-CM

## 2017-11-17 MED ORDER — ZOLPIDEM TARTRATE 10 MG PO TABS: 5.0000 mg | ORAL_TABLET | Freq: Every evening | ORAL | 3 refills | Status: DC | PRN

## 2017-11-17 NOTE — Telephone Encounter (Signed)
[facility] faxed a refill request for a 90-days supply for the following medication. Thanks CC  zolpidem (AMBIEN) 10 MG tablet

## 2018-07-06 ENCOUNTER — Encounter: Payer: Self-pay | Admitting: Family Medicine

## 2018-07-07 ENCOUNTER — Ambulatory Visit (INDEPENDENT_AMBULATORY_CARE_PROVIDER_SITE_OTHER): Payer: Commercial Managed Care - PPO | Admitting: Family Medicine

## 2018-07-07 ENCOUNTER — Other Ambulatory Visit: Payer: Self-pay

## 2018-07-07 VITALS — BP 148/79 | HR 64 | Temp 98.7°F | Resp 16 | Wt 259.0 lb

## 2018-07-07 DIAGNOSIS — Z0000 Encounter for general adult medical examination without abnormal findings: Secondary | ICD-10-CM

## 2018-07-07 DIAGNOSIS — Z125 Encounter for screening for malignant neoplasm of prostate: Secondary | ICD-10-CM

## 2018-07-07 DIAGNOSIS — I10 Essential (primary) hypertension: Secondary | ICD-10-CM | POA: Diagnosis not present

## 2018-07-07 DIAGNOSIS — Z23 Encounter for immunization: Secondary | ICD-10-CM

## 2018-07-07 DIAGNOSIS — G4709 Other insomnia: Secondary | ICD-10-CM

## 2018-07-07 DIAGNOSIS — I440 Atrioventricular block, first degree: Secondary | ICD-10-CM

## 2018-07-07 MED ORDER — LISINOPRIL 20 MG PO TABS: 20.0000 mg | ORAL_TABLET | Freq: Every day | ORAL | 4 refills | Status: DC

## 2018-07-07 MED ORDER — HYDROCHLOROTHIAZIDE 25 MG PO TABS: 25.0000 mg | ORAL_TABLET | Freq: Every day | ORAL | 4 refills | Status: DC

## 2018-07-07 NOTE — Patient Instructions (Addendum)
You were given the first dose of Shingrix today. You will need to get second dose after 2 months.   . It is recommended to engage in 150 minutes of moderate exercise every week.    DASH Eating Plan DASH stands for "Dietary Approaches to Stop Hypertension." The DASH eating plan is a healthy eating plan that has been shown to reduce high blood pressure (hypertension). It may also reduce your risk for type 2 diabetes, heart disease, and stroke. The DASH eating plan may also help with weight loss. What are tips for following this plan? General guidelines  Avoid eating more than 2,300 mg (milligrams) of salt (sodium) a day. If you have hypertension, you may need to reduce your sodium intake to 1,500 mg a day.  Limit alcohol intake to no more than 1 drink a day for nonpregnant women and 2 drinks a day for men. One drink equals 12 oz of beer, 5 oz of wine, or 1 oz of hard liquor.  Work with your health care provider to maintain a healthy body weight or to lose weight. Ask what an ideal weight is for you.  Get at least 30 minutes of exercise that causes your heart to beat faster (aerobic exercise) most days of the week. Activities may include walking, swimming, or biking.  Work with your health care provider or diet and nutrition specialist (dietitian) to adjust your eating plan to your individual calorie needs. Reading food labels  Check food labels for the amount of sodium per serving. Choose foods with less than 5 percent of the Daily Value of sodium. Generally, foods with less than 300 mg of sodium per serving fit into this eating plan.  To find whole grains, look for the word "whole" as the first word in the ingredient list. Shopping  Buy products labeled as "low-sodium" or "no salt added."  Buy fresh foods. Avoid canned foods and premade or frozen meals. Cooking  Avoid adding salt when cooking. Use salt-free seasonings or herbs instead of table salt or sea salt. Check with your health  care provider or pharmacist before using salt substitutes.  Do not fry foods. Cook foods using healthy methods such as baking, boiling, grilling, and broiling instead.  Cook with heart-healthy oils, such as olive, canola, soybean, or sunflower oil. Meal planning   Eat a balanced diet that includes: ? 5 or more servings of fruits and vegetables each day. At each meal, try to fill half of your plate with fruits and vegetables. ? Up to 6-8 servings of whole grains each day. ? Less than 6 oz of lean meat, poultry, or fish each day. A 3-oz serving of meat is about the same size as a deck of cards. One egg equals 1 oz. ? 2 servings of low-fat dairy each day. ? A serving of nuts, seeds, or beans 5 times each week. ? Heart-healthy fats. Healthy fats called Omega-3 fatty acids are found in foods such as flaxseeds and coldwater fish, like sardines, salmon, and mackerel.  Limit how much you eat of the following: ? Canned or prepackaged foods. ? Food that is high in trans fat, such as fried foods. ? Food that is high in saturated fat, such as fatty meat. ? Sweets, desserts, sugary drinks, and other foods with added sugar. ? Full-fat dairy products.  Do not salt foods before eating.  Try to eat at least 2 vegetarian meals each week.  Eat more home-cooked food and less restaurant, buffet, and fast food.  When eating at a restaurant, ask that your food be prepared with less salt or no salt, if possible. What foods are recommended? The items listed may not be a complete list. Talk with your dietitian about what dietary choices are best for you. Grains Whole-grain or whole-wheat bread. Whole-grain or whole-wheat pasta. Brown rice. [place]. Bulgur. Whole-grain and low-sodium cereals. Pita bread. Low-fat, low-sodium crackers. Whole-wheat flour tortillas. Vegetables Fresh or frozen vegetables (raw, steamed, roasted, or grilled). Low-sodium or reduced-sodium tomato and vegetable juice.  Low-sodium or reduced-sodium tomato sauce and tomato paste. Low-sodium or reduced-sodium canned vegetables. Fruits All fresh, dried, or frozen fruit. Canned fruit in natural juice (without added sugar). Meat and other protein foods Skinless chicken or [place]. Ground chicken or [place]. Pork with fat trimmed off. Fish and seafood. Egg whites. Dried beans, peas, or lentils. Unsalted nuts, nut butters, and seeds. Unsalted canned beans. Lean cuts of beef with fat trimmed off. Low-sodium, lean deli meat. Dairy Low-fat (1%) or fat-free (skim) milk. Fat-free, low-fat, or reduced-fat cheeses. Nonfat, low-sodium ricotta or cottage cheese. Low-fat or nonfat yogurt. Low-fat, low-sodium cheese. Fats and oils Soft margarine without trans fats. Vegetable oil. Low-fat, reduced-fat, or light mayonnaise and salad dressings (reduced-sodium). Canola, safflower, olive, soybean, and sunflower oils. Avocado. Seasoning and other foods Herbs. Spices. Seasoning mixes without salt. Unsalted popcorn and pretzels. Fat-free sweets. What foods are not recommended? The items listed may not be a complete list. Talk with your dietitian about what dietary choices are best for you. Grains Baked goods made with fat, such as croissants, muffins, or some breads. Dry pasta or rice meal packs. Vegetables Creamed or fried vegetables. Vegetables in a cheese sauce. Regular canned vegetables (not low-sodium or reduced-sodium). Regular canned tomato sauce and paste (not low-sodium or reduced-sodium). Regular tomato and vegetable juice (not low-sodium or reduced-sodium). Angie Fava. Olives. Fruits Canned fruit in a light or heavy syrup. Fried fruit. Fruit in cream or butter sauce. Meat and other protein foods Fatty cuts of meat. Ribs. Fried meat. Berniece Salines. Sausage. Bologna and other processed lunch meats. Salami. Fatback. Hotdogs. Bratwurst. Salted nuts and seeds. Canned beans with added salt. Canned or smoked fish. Whole eggs or egg yolks. Chicken  or [place] with skin. Dairy Whole or 2% milk, cream, and half-and-half. Whole or full-fat cream cheese. Whole-fat or sweetened yogurt. Full-fat cheese. Nondairy creamers. Whipped toppings. Processed cheese and cheese spreads. Fats and oils Butter. Stick margarine. Lard. Shortening. Ghee. Bacon fat. Tropical oils, such as coconut, palm kernel, or palm oil. Seasoning and other foods Salted popcorn and pretzels. Onion salt, garlic salt, seasoned salt, table salt, and sea salt. Worcestershire sauce. Tartar sauce. Barbecue sauce. Teriyaki sauce. Soy sauce, including reduced-sodium. Steak sauce. Canned and packaged gravies. Fish sauce. Oyster sauce. Cocktail sauce. Horseradish that you find on the shelf. Ketchup. Mustard. Meat flavorings and tenderizers. Bouillon cubes. Hot sauce and Tabasco sauce. Premade or packaged marinades. Premade or packaged taco seasonings. Relishes. Regular salad dressings. Where to find more information:  National Heart, Lung, and [place]: https://wilson-eaton.com/  American Heart Association: www.heart.org Summary  The DASH eating plan is a healthy eating plan that has been shown to reduce high blood pressure (hypertension). It may also reduce your risk for type 2 diabetes, heart disease, and stroke.  With the DASH eating plan, you should limit salt (sodium) intake to 2,300 mg a day. If you have hypertension, you may need to reduce your sodium intake to 1,500 mg a day.  When on the DASH eating plan,  aim to eat more fresh fruits and vegetables, whole grains, lean proteins, low-fat dairy, and heart-healthy fats.  Work with your health care provider or diet and nutrition specialist (dietitian) to adjust your eating plan to your individual calorie needs. This information is not intended to replace advice given to you by your health care provider. Make sure you discuss any questions you have with your health care provider. Document Released: 06/27/2011 Document Revised:  07/01/2016 Document Reviewed: 07/01/2016 Elsevier Interactive Patient Education  [employer].

## 2018-07-07 NOTE — Progress Notes (Signed)
Patient: John Duran, Male    DOB: 1960-10-16, 57 y.o.   MRN: [id number] Visit Date: 07/07/2018  Today's Provider: Mila Merry, MD   Chief Complaint  Patient presents with  . Annual Exam  . Hypertension  . Depression  . Insomnia   Subjective:    Annual physical exam John Duran is a 57 y.o. male who presents today for health maintenance and complete physical. He feels fairly well. He reports exercising daily. He reports he is sleeping fairly well.  -----------------------------------------------------------------  Hypertension, follow-up:  BP Readings from Last 3 Encounters:  07/07/18 (!) 150/80  06/06/17 120/64  04/24/16 (!) 148/90    He was last seen for hypertension 1 years ago.  BP at that visit was 120/64. Management since that visit includes no changes. He reports good compliance with treatment. He is not having side effects.  He is exercising. He is adherent to low salt diet.   Outside blood pressures are not being checked. He is experiencing none.  Patient denies chest pain, chest pressure/discomfort, claudication, dyspnea, exertional chest pressure/discomfort, fatigue, irregular heart beat, lower extremity edema, near-syncope, orthopnea, palpitations, paroxysmal nocturnal dyspnea, syncope and tachypnea.   Cardiovascular risk factors include hypertension and male gender.  Use of agents associated with hypertension: none.     Weight trend: increasing steadily Wt Readings from Last 3 Encounters:  07/07/18 259 lb (117.5 kg)  06/06/17 249 lb (112.9 kg)  04/24/16 236 lb (107 kg)    Current diet: well balanced  ------------------------------------------------------------------------ Depression: Patient was last seen for this problem 1 year ago and no changes were made. Patient states his Depression has worsened since the last visit.   BP Readings from Last 3 Encounters:  07/07/18 (!) 148/79  06/06/17 120/64  04/24/16 (!) 148/90      Insomnia: Patient was last seen for this problem 1 year ago. Management during that visit includes starting Ambien as needed. Today patient reports this problem is stable.   Review of Systems  Constitutional: Positive for fatigue. Negative for appetite change, chills and fever.  HENT: Negative for congestion, ear pain, hearing loss, nosebleeds and trouble swallowing.   Eyes: Positive for itching. Negative for pain and visual disturbance.  Respiratory: Negative for cough, chest tightness and shortness of breath.   Cardiovascular: Negative for chest pain, palpitations and leg swelling.  Gastrointestinal: Negative for abdominal pain, blood in stool, constipation, diarrhea, nausea and vomiting.  Endocrine: Negative for polydipsia, polyphagia and polyuria.  Genitourinary: Negative for dysuria and flank pain.  Musculoskeletal: Positive for arthralgias. Negative for back pain, joint swelling, myalgias and neck stiffness.  Skin: Negative for color change, rash and wound.  Allergic/Immunologic: Positive for environmental allergies.  Neurological: Negative for dizziness, tremors, seizures, speech difficulty, weakness, light-headedness and headaches.  Psychiatric/Behavioral: Positive for decreased concentration and sleep disturbance. Negative for behavioral problems, confusion and dysphoric mood. The patient is nervous/anxious.   All other systems reviewed and are negative.   Social History      He  reports that he has never smoked. He has never used smokeless tobacco. He reports that he does not drink alcohol or use drugs.       Social History   Socioeconomic History  . Marital status: Married    Spouse name: Not on file  . Number of children: Not on file  . Years of education: Not on file  . Highest education level: Not on file  Occupational History  . Not on file  Social Needs  . Financial resource strain: Not on file  . Food insecurity:    Worry: Not on file    Inability: Not on  file  . Transportation needs:    Medical: Not on file    Non-medical: Not on file  Tobacco Use  . Smoking status: Never Smoker  . Smokeless tobacco: Never Used  Substance and Sexual Activity  . Alcohol use: No  . Drug use: No  . Sexual activity: Not on file  Lifestyle  . Physical activity:    Days per week: Not on file    Minutes per session: Not on file  . Stress: Not on file  Relationships  . Social connections:    Talks on phone: Not on file    Gets together: Not on file    Attends religious service: Not on file    Active member of club or organization: Not on file    Attends meetings of clubs or organizations: Not on file    Relationship status: Not on file  Other Topics Concern  . Not on file  Social History Narrative  . Not on file    Past Medical History:  Diagnosis Date  . Elevated C-reactive protein 09/18/2008  . Elevated prostate specific antigen (PSA) 09/18/2008   has had urology work up on including negative prostate biopsies.  . Erectile dysfunction   . Hypertension      Patient Active Problem List   Diagnosis Date Noted  . Depression 04/24/2016  . 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 Hernia, inguinal 03/14/2015  . Obstructive sleep apnea 03/14/2015  . Abnormal C-reactive protein 09/18/2008  . Abnormal prostate specific antigen 09/18/2008  . BP (high blood pressure) 09/09/2008  . Benign prostatic hypertrophy without urinary obstruction 12/25/2006  . Morbid obesity (HCC) 12/25/2006  . 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 APPENDECTOMY  05/26/2010   Dr. Michela Pitcher, [facility]  . CHOLECYSTECTOMY  2012   [facility]  . HERNIA REPAIR    . Sleeves Gastrectomy  12/2013   with hiatal hernia repair, Dr. Alva Garnet  . VASECTOMY  1997    Family History        Family Status  Relation Name Status  . Mother  Alive  . Father  Deceased at age 84       ilness of dementia and glaucoma  . Brother   Alive  . MGF  Deceased       died from heart disease  . PGF  Deceased       died from liver cancer        His family history includes Diabetes in his mother.      Allergies  Allergen Reactions  . Androderm [Testosterone]     Androderm (12/25/2006     Current Outpatient Medications:  .  diclofenac (VOLTAREN) 25 MG EC tablet, Take 25 mg by mouth daily as needed., Disp: , Rfl:  .  FLUoxetine (PROZAC) 20 MG capsule, Take 1 capsule (20 mg total) by mouth daily., Disp: 90 capsule, Rfl: 3 .  lisinopril-hydrochlorothiazide (PRINZIDE,ZESTORETIC) 20-12.5 MG tablet, Take 1 tablet by mouth daily., Disp: 90 tablet, Rfl: 3 .  Loratadine 10 MG CAPS, Take 1 capsule by mouth daily., Disp: , Rfl:  .  OMEGA-3 FATTY ACIDS PO, Take 2 capsules by mouth daily., Disp: , Rfl:  .  tadalafil (CIALIS) 5 MG tablet, Take 1 tablet (5 mg total) by mouth daily as needed for  erectile dysfunction., Disp: 90 tablet, Rfl: 3 .  zolpidem (AMBIEN) 10 MG tablet, Take 0.5-1 tablets (5-10 mg total) by mouth at bedtime as needed for sleep., Disp: 180 tablet, Rfl: 3   Patient Care Team: Malva Limes, MD as PCP - General (Family Medicine) Kandyce Rud., MD as Consulting Physician (Rheumatology)      Objective:   Vitals: BP (!) 150/80 (BP Location: Left Arm, Patient Position: Sitting, Cuff Size: Large)   Pulse 64   Temp 98.7 F (37.1 C) (Oral)   Resp 16   Wt 259 lb (117.5 kg)   SpO2 97% Comment: room air  BMI 36.12 kg/m    Vitals:   07/07/18 1401 07/07/18 1410  BP: (!) 150/80 (!) 148/79  Pulse: 64   Resp: 16   Temp: 98.7 F (37.1 C)   TempSrc: Oral   SpO2: 97%   Weight: 259 lb (117.5 kg)      Physical Exam   General Appearance:    Alert, cooperative, no distress, appears stated age, obese  Head:    Normocephalic, without obvious abnormality, atraumatic  Eyes:    PERRL, conjunctiva/corneas clear, EOM's intact, fundi    benign, both eyes       Ears:    Normal TM's and external ear canals,  both ears  Nose:   Nares normal, septum midline, mucosa normal, no drainage   or sinus tenderness  Throat:   Lips, mucosa, and tongue normal; teeth and gums normal  Neck:   Supple, symmetrical, trachea midline, no adenopathy;       thyroid:  No enlargement/tenderness/nodules; no carotid   bruit or JVD  Back:     Symmetric, no curvature, ROM normal, no CVA tenderness  Lungs:     Clear to auscultation bilaterally, respirations unlabored  Chest wall:    No tenderness or deformity  Heart:    Regular rate and rhythm, S1 and S2 normal, no murmur, rub   or gallop  Abdomen:     Soft, non-tender, bowel sounds active all four quadrants,    no masses, no organomegaly  Genitalia:    deferred  Rectal:    deferred  Extremities:   Extremities normal, atraumatic, no cyanosis or edema  Pulses:   2+ and symmetric all extremities  Skin:   Skin color, texture, turgor normal, no rashes or lesions  Lymph nodes:   Cervical, supraclavicular, and axillary nodes normal  Neurologic:   CNII-XII intact. Normal strength, sensation and reflexes      throughout    Depression Screen PHQ 2/9 Scores 07/07/2018  PHQ - 2 Score 2  PHQ- 9 Score 12      Assessment & Plan:     Routine Health Maintenance and Physical Exam  Exercise Activities and Dietary recommendations Goals   None     Immunization History  Administered Date(s) Administered  . Influenza Split 05/05/2009, 05/08/2011, 05/05/2012  . Influenza,inj,Quad PF,6+ Mos 04/24/2013, 05/07/2014, 05/06/2015  . Influenza-Unspecified 04/21/2014  . Td 07/22/1994  . Tdap 09/13/2008    Health Maintenance  Topic Date Due  . HIV Screening  07/20/1978  . INFLUENZA VACCINE  02/19/2018  . TETANUS/TDAP  09/13/2018  . COLONOSCOPY  06/28/2024  . Hepatitis C Screening  Completed     Discussed health benefits of physical activity, and encouraged him to engage in regular exercise appropriate for his age and condition.     --------------------------------------------------------------------  1. Annual physical exam  - EKG 12-Lead  2. Need for influenza vaccination  -  Flu Vaccine QUAD 6+ mos PF IM (Fluarix Quad PF)  3. Morbid obesity (HCC) Counseled regarding prudent diet and regular exercise.   4. Essential hypertension Up a bit today. Change lisinopril-hctz from 20-12.5 to lisinopril 20 and hctz 25.  - EKG 12-Lead - Comprehensive metabolic panel - Lipid panel - hydrochlorothiazide (HYDRODIURIL) 25 MG tablet; Take 1 tablet (25 mg total) by mouth daily.  Dispense: 90 tablet; Refill: 4  5. Other insomnia Doing well with OTC melatonin and occasional zolpidem.   6. Prostate cancer screening  - Comprehensive metabolic panel - Lipid panel - PSA - TSH  7. Need for shingles vaccine  - Varicella-zoster vaccine IM      Mila Merryonald Ladislao Cohenour, MD  [facility]

## 2018-07-09 LAB — PSA: Prostate Specific Ag, Serum: 5.5 ng/mL — ABNORMAL HIGH (ref 0.0–4.0)

## 2018-07-09 LAB — COMPREHENSIVE METABOLIC PANEL
ALT: 17 IU/L (ref 0–44)
AST: 21 IU/L (ref 0–40)
Albumin/Globulin Ratio: 1.9 (ref 1.2–2.2)
Albumin: 4.5 g/dL (ref 3.5–5.5)
Alkaline Phosphatase: 80 IU/L (ref 39–117)
BUN/Creatinine Ratio: 12 (ref 9–20)
BUN: 12 mg/dL (ref 6–24)
Bilirubin Total: 0.5 mg/dL (ref 0.0–1.2)
CO2: 24 mmol/L (ref 20–29)
Calcium: 9.4 mg/dL (ref 8.7–10.2)
Chloride: 99 mmol/L (ref 96–106)
Creatinine, Ser: 0.98 mg/dL (ref 0.76–1.27)
GFR calc Af Amer: 101 mL/min/{1.73_m2} (ref >59)
GFR calc non Af Amer: 87 mL/min/{1.73_m2} (ref >59)
GLUCOSE: 87 mg/dL (ref 65–99)
Globulin, Total: 2.4 g/dL (ref 1.5–4.5)
Potassium: 3.6 mmol/L (ref 3.5–5.2)
Sodium: 141 mmol/L (ref 134–144)
Total Protein: 6.9 g/dL (ref 6.0–8.5)

## 2018-07-09 LAB — LIPID PANEL
Chol/HDL Ratio: 4.1 ratio (ref 0.0–5.0)
Cholesterol, Total: 190 mg/dL (ref 100–199)
HDL: 46 mg/dL (ref >39)
LDL Calculated: 118 mg/dL — ABNORMAL HIGH (ref 0–99)
Triglycerides: 130 mg/dL (ref 0–149)
VLDL CHOLESTEROL CAL: 26 mg/dL (ref 5–40)

## 2018-07-09 LAB — TSH: TSH: 1.28 u[IU]/mL (ref 0.450–4.500)

## 2020-07-20 ENCOUNTER — Telehealth: Payer: Commercial Managed Care - PPO | Admitting: Physician Assistant

## 2020-07-24 ENCOUNTER — Telehealth (INDEPENDENT_AMBULATORY_CARE_PROVIDER_SITE_OTHER): Payer: Commercial Managed Care - PPO | Admitting: Family Medicine

## 2020-07-24 ENCOUNTER — Other Ambulatory Visit: Payer: Self-pay

## 2020-07-24 ENCOUNTER — Encounter: Payer: Self-pay | Admitting: Family Medicine

## 2020-07-24 DIAGNOSIS — Z8616 Personal history of COVID-19: Secondary | ICD-10-CM | POA: Diagnosis not present

## 2020-07-24 NOTE — Progress Notes (Signed)
MyChart Video Visit    Virtual Visit via Video Note   This visit type was conducted due to national recommendations for restrictions regarding the COVID-19 Pandemic (e.g. social distancing) in an effort to limit this patient's exposure and mitigate transmission in our community. This patient is at least at moderate risk for complications without adequate follow up. This format is felt to be most appropriate for this patient at this time. Physical exam was limited by quality of the video and audio technology used for the visit.   Patient location: home Provider location: bfp  I discussed the limitations of evaluation and management by telemedicine and the availability of in person appointments. The patient expressed understanding and agreed to proceed.  Patient: John Duran   DOB: 1960/12/25   60 y.o. Male  MRN: [id number] Visit Date: 07/24/2020  Today's healthcare provider: Mila Merry, MD   No chief complaint on file.  Subjective    HPI  Patient tested positive for covid on 07/18/2020. He is planning to travel to [place]. He needs a medical clearance saying that he is ok to travel post covid. He did have two doses of [employer] vaccine last year in [place]. He reports he developed fever, myalgia, cough and cold symptoms on December 20th. He reports he began feeling better after a few days and by December 26th all of his symptoms had completely resolved and has had no respiratory since then. He works in [place] and had come [place] the [place]. He is required to have Covid test before returning to [place]. He had a positive rapid antigen test on December 28th which was positive, and a positive PCR test on December. He is not able to return to [place] with a negative Covid test, or a medical statement that he is fully recovered and non infections, and at least 14 days have passed since the first positive PCR test.      Medications: Outpatient  Medications Prior to Visit  Medication Sig  . diclofenac (VOLTAREN) 25 MG EC tablet Take 25 mg by mouth daily as needed.  . diclofenac sodium (VOLTAREN) 1 % GEL Apply topically 4 (four) times daily.  Marland Kitchen FLUoxetine (PROZAC) 20 MG capsule Take 1 capsule (20 mg total) by mouth daily.  . hydrochlorothiazide (HYDRODIURIL) 25 MG tablet Take 1 tablet (25 mg total) by mouth daily.  Marland Kitchen lisinopril (PRINIVIL,ZESTRIL) 20 MG tablet Take 1 tablet (20 mg total) by mouth daily.  . Loratadine 10 MG CAPS Take 1 capsule by mouth daily.  . OMEGA-3 FATTY ACIDS PO Take 2 capsules by mouth daily.  . tadalafil (CIALIS) 5 MG tablet Take 1 tablet (5 mg total) by mouth daily as needed for erectile dysfunction.  Marland Kitchen zolpidem (AMBIEN) 10 MG tablet Take 0.5-1 tablets (5-10 mg total) by mouth at bedtime as needed for sleep.   No facility-administered medications prior to visit.       Objective    There were no vitals taken for this visit.   Physical Exam   Awake, alert, oriented x 3. In no apparent distress    Assessment & Plan     1. Personal history of COVID-19 He has completely recovered. Will write medical statement with facts of case.     I discussed the assessment and treatment plan with the patient. The patient was provided an opportunity to ask questions and all were answered. The patient agreed with the plan and demonstrated an understanding of the instructions.  The patient was advised to call back or seek an in-person evaluation if the symptoms worsen or if the condition fails to improve as anticipated.  I provided 12 minutes of non-face-to-face time during this encounter.  The entirety of the information documented in the History of Present Illness, Review of Systems and Physical Exam were personally obtained by me. Portions of this information were initially documented by the CMA and reviewed by me for thoroughness and accuracy.     Mila Merry, MD [facility] [phone]  (phone) [phone] (fax)  [facility]

## 2020-07-28 ENCOUNTER — Encounter: Payer: Self-pay | Admitting: Family Medicine

## 2020-07-28 DIAGNOSIS — F32A Depression, unspecified: Secondary | ICD-10-CM

## 2020-07-28 DIAGNOSIS — G4709 Other insomnia: Secondary | ICD-10-CM

## 2020-07-28 MED ORDER — LISINOPRIL 20 MG PO TABS: 20.0000 mg | ORAL_TABLET | Freq: Every day | ORAL | 1 refills | Status: DC

## 2020-07-28 MED ORDER — ZOLPIDEM TARTRATE 10 MG PO TABS: 5.0000 mg | ORAL_TABLET | Freq: Every evening | ORAL | 1 refills | Status: DC | PRN

## 2020-07-28 MED ORDER — FLUOXETINE HCL 20 MG PO CAPS: 20.0000 mg | ORAL_CAPSULE | Freq: Every day | ORAL | 1 refills | Status: AC

## 2020-07-28 NOTE — Telephone Encounter (Signed)
Please review. Thanks!  

## 2020-08-04 ENCOUNTER — Telehealth: Payer: Commercial Managed Care - PPO | Admitting: Family Medicine

## 2020-08-04 ENCOUNTER — Encounter: Payer: Self-pay | Admitting: Family Medicine

## 2020-08-04 DIAGNOSIS — Z8616 Personal history of COVID-19: Secondary | ICD-10-CM | POA: Diagnosis not present

## 2020-08-04 NOTE — Progress Notes (Signed)
MyChart Video Visit    Virtual Visit via Video Note   This visit type was conducted due to national recommendations for restrictions regarding the COVID-19 Pandemic (e.g. social distancing) in an effort to limit this patient's exposure and mitigate transmission in our community. This patient is at least at moderate risk for complications without adequate follow up. This format is felt to be most appropriate for this patient at this time. Physical exam was limited by quality of the video and audio technology used for the visit.   Patient location: hotel Provider location: bfp  I discussed the limitations of evaluation and management by telemedicine and the availability of in person appointments. The patient expressed understanding and agreed to proceed.  Patient: John Duran   DOB: 1960/08/11   60 y.o. Male  MRN: [id number] Visit Date: 08/04/2020  Today's healthcare provider: Mila Merry, MD   No chief complaint on file.  Subjective    HPI  Patient tested positive for COVID on 07/18/2020 which was done for overseas travel requirement. Marland Kitchen He reports that he has fully recovered from recent COVID infection. In summary he first developed sx on 07/10/2020 and had completely recovered within a week.  He has felt well every since with no Covid, cold, or flu symptoms. He is scheduled to fly out of the [place] to [place] tomorrow, then on to [place]. He requires a medical statement regarding current symptoms. He reports he has had a negative PCR test since he was last seen on 07-24-2020.     Medications: Outpatient Medications Prior to Visit  Medication Sig  . diclofenac (VOLTAREN) 25 MG EC tablet Take 25 mg by mouth daily as needed.  . diclofenac sodium (VOLTAREN) 1 % GEL Apply topically 4 (four) times daily.  Marland Kitchen FLUoxetine (PROZAC) 20 MG capsule Take 1 capsule (20 mg total) by mouth daily.  . hydrochlorothiazide (HYDRODIURIL) 25 MG tablet Take 1 tablet (25 mg total) by mouth daily.  Marland Kitchen  lisinopril (ZESTRIL) 20 MG tablet Take 1 tablet (20 mg total) by mouth daily.  . Loratadine 10 MG CAPS Take 1 capsule by mouth daily.  . tadalafil (CIALIS) 5 MG tablet Take 1 tablet (5 mg total) by mouth daily as needed for erectile dysfunction.  Marland Kitchen zolpidem (AMBIEN) 10 MG tablet Take 0.5-1 tablets (5-10 mg total) by mouth at bedtime as needed for sleep.  . [DISCONTINUED] OMEGA-3 FATTY ACIDS PO Take 2 capsules by mouth daily.   No facility-administered medications prior to visit.    Review of Systems  Constitutional: Negative for appetite change, chills and fever.  Respiratory: Negative for chest tightness, shortness of breath and wheezing.   Cardiovascular: Negative for chest pain and palpitations.  Gastrointestinal: Negative for abdominal pain, nausea and vomiting.      Objective    There were no vitals taken for this visit.   Physical Exam   Awake, alert, oriented x 3. In no apparent distress Appears well. No coughing hoarseness, sniffles, voice changes or appearance of Malaise.   Assessment & Plan     1. Personal history of COVID-19 He is fully vaccinated and has been completely asymptomatic since December 26th, 2021. Medical statement describing facts of his condition was printed and signed for him or his wife to pick up, as required by his travel destinations.      I discussed the assessment and treatment plan with the patient. The patient was provided an opportunity to ask questions and all were answered. The patient agreed with  the plan and demonstrated an understanding of the instructions.   The patient was advised to call back or seek an in-person evaluation if the symptoms worsen or if the condition fails to improve as anticipated.  I provided 9 minutes of non-face-to-face time during this encounter.  The entirety of the information documented in the History of Present Illness, Review of Systems and Physical Exam were personally obtained by me. Portions of this  information were initially documented by the CMA and reviewed by me for thoroughness and accuracy.     Mila Merry, MD [facility] [phone] (phone) [phone] (fax)  [facility]

## 2021-12-24 ENCOUNTER — Ambulatory Visit (INDEPENDENT_AMBULATORY_CARE_PROVIDER_SITE_OTHER): Payer: 59 | Admitting: Family Medicine

## 2021-12-24 ENCOUNTER — Encounter: Payer: Self-pay | Admitting: Family Medicine

## 2021-12-24 VITALS — BP 131/70 | HR 61 | Temp 99.0°F | Resp 16 | Ht 70.0 in | Wt 277.9 lb

## 2021-12-24 DIAGNOSIS — I10 Essential (primary) hypertension: Secondary | ICD-10-CM | POA: Diagnosis not present

## 2021-12-24 DIAGNOSIS — M7551 Bursitis of right shoulder: Secondary | ICD-10-CM

## 2021-12-24 MED ORDER — TELMISARTAN 80 MG PO TABS
80.0000 mg | ORAL_TABLET | Freq: Every day | ORAL | 0 refills | Status: DC
Start: 2021-12-24 — End: 2022-01-21

## 2021-12-24 MED ORDER — NAPROXEN 500 MG PO TABS
500.0000 mg | ORAL_TABLET | Freq: Two times a day (BID) | ORAL | 0 refills | Status: DC
Start: 2021-12-24 — End: 2024-04-23

## 2021-12-24 MED ORDER — AMLODIPINE BESYLATE 10 MG PO TABS: 10.0000 mg | ORAL_TABLET | Freq: Every day | ORAL | 0 refills | Status: DC

## 2021-12-24 MED ORDER — CYCLOBENZAPRINE HCL 10 MG PO TABS
10.0000 mg | ORAL_TABLET | Freq: Three times a day (TID) | ORAL | 0 refills | Status: DC | PRN
Start: 2021-12-24 — End: 2024-05-31

## 2021-12-24 NOTE — Progress Notes (Unsigned)
     I,Tiffany J Bragg,acting as a scribe for Mila Merry, MD.,have documented all relevant documentation on the behalf of Mila Merry, MD,as directed by  Mila Merry, MD while in the presence of Mila Merry, MD.   Established patient visit   Patient: John Duran   DOB: Mar 19, 1961   61 y.o. Male  MRN: [id number] Visit Date: 12/24/2021  Today's healthcare provider: Mila Merry, MD   Chief Complaint  Patient presents with   Shoulder Pain    Patient complains of R shoulder pain for 4 days.    Subjective    Shoulder Pain  The pain is present in the right shoulder.  HPI     Shoulder Pain    Additional comments: Patient complains of R shoulder pain for 4 days.       Last edited by Marlana Salvage, CMA on 12/24/2021  4:28 PM.      He has had identical symptoms several times over the past 10-12 years and states it usually resolves with a few days of prescription medications. Review or previous records show similar symptoms treated with naproxen and cyclobenzaprine.        Objective    BP 131/70 (BP Location: Left Arm, Patient Position: Sitting, Cuff Size: Large)   Pulse 61   Temp 99 F (37.2 C) (Oral)   Resp 16   Ht 5\' 10"  (1.778 m)   Wt 277 lb 14.4 oz (126.1 kg)   SpO2 98%   BMI 39.87 kg/m    Physical Exam   Tender over right acromium. No gross deformity. No swelling or erythema. Pain with abduction >90 degree. Positive empty can test.   Assessment & Plan     1. Subacromial bursitis of right shoulder joint  - naproxen (NAPROSYN) 500 MG tablet; Take 1 tablet (500 mg total) by mouth 2 (two) times daily with a meal.  Dispense: 30 tablet; Refill: 0 - cyclobenzaprine (FLEXERIL) 10 MG tablet; Take 1 tablet (10 mg total) by mouth 3 (three) times daily as needed. May cause drowsiness  Dispense: 30 tablet; Refill: 0  2. Hypertension  - telmisartan (MICARDIS) 80 MG tablet; Take 1 tablet (80 mg total) by mouth daily.  Dispense: 30 tablet; Refill: 0 -  amLODipine (NORVASC) 10 MG tablet; Take 1 tablet (10 mg total) by mouth daily.  Dispense: 30 tablet; Refill: 0      The entirety of the information documented in the History of Present Illness, Review of Systems and Physical Exam were personally obtained by me. Portions of this information were initially documented by the CMA and reviewed by me for thoroughness and accuracy.     , MD  [facility] [phone] (phone) [phone] (fax)  [facility]

## 2022-01-20 ENCOUNTER — Other Ambulatory Visit: Payer: Self-pay | Admitting: Family Medicine

## 2024-04-23 ENCOUNTER — Ambulatory Visit: Admitting: Family Medicine

## 2024-04-23 ENCOUNTER — Encounter: Payer: Self-pay | Admitting: Family Medicine

## 2024-04-23 ENCOUNTER — Ambulatory Visit
Admission: RE | Admit: 2024-04-23 | Discharge: 2024-04-23 | Disposition: A | Discharge status: Home / Self Care | Source: Ambulatory Visit | Attending: Family Medicine | Admitting: Family Medicine

## 2024-04-23 ENCOUNTER — Ambulatory Visit
Admission: RE | Admit: 2024-04-23 | Discharge: 2024-04-23 | Disposition: A | Discharge status: Home / Self Care | Attending: Family Medicine | Admitting: Family Medicine

## 2024-04-23 VITALS — BP 154/71 | HR 87 | Ht 70.0 in | Wt 284.0 lb

## 2024-04-23 DIAGNOSIS — M79672 Pain in left foot: Secondary | ICD-10-CM

## 2024-04-23 DIAGNOSIS — M778 Other enthesopathies, not elsewhere classified: Secondary | ICD-10-CM | POA: Diagnosis not present

## 2024-04-23 DIAGNOSIS — Z23 Encounter for immunization: Secondary | ICD-10-CM | POA: Diagnosis not present

## 2024-04-23 DIAGNOSIS — G4709 Other insomnia: Secondary | ICD-10-CM | POA: Diagnosis not present

## 2024-04-23 MED ORDER — NAPROXEN 500 MG PO TABS: 500.0000 mg | ORAL_TABLET | Freq: Two times a day (BID) | ORAL | 0 refills | Status: AC

## 2024-04-23 MED ORDER — ZOLPIDEM TARTRATE 10 MG PO TABS: 5.0000 mg | ORAL_TABLET | Freq: Every evening | ORAL | 0 refills | Status: AC | PRN

## 2024-04-23 NOTE — Progress Notes (Signed)
 Established patient visit   Patient: John Duran   DOB: 29-Nov-1960   63 y.o. Male  MRN: [id number] Visit Date: 04/23/2024  Today's healthcare provider: Nancyann Perry, MD   Chief Complaint  Patient presents with   Acute Visit    Patient is preent due to wrist and foot concerns   Wrist Problem    Bulged on left wrist X 2 -3 weeks that starting to cause some problems when typing and not sure what it is   Foot Problem    Left side of foot began aching about 6 weeks ago and has gradually become worse. Taking ibuprofen 800 mg every 8 hrs   Subjective    Discussed the use of AI scribe software for clinical note transcription with the patient, who gave verbal consent to proceed.  History of Present Illness   John Duran is a 63 year old male with a history of arthritis who presents with wrist and foot pain.  He has been experiencing wrist pain for the past two to three weeks. Initially mild, the pain has become more bothersome and is localized to a specific spot on the wrist, with no associated numbness or tingling in the hand. He has a history of arthritis. He has been taking ibuprofen, 800 mg every eight hours, to manage the discomfort.  He also reports severe left foot pain that began approximately six weeks ago. The pain is located on the left side of the foot and feels as if something is broken. His wife suspects a tendon issue, and he has experienced occasional tingling in the area. The pain persists despite taking ibuprofen.  He mentions a recent history of taking antibiotics for a fistula repair about six weeks ago. Since then, he has not had any significant health issues aside from gaining weight. He is currently living in his old house with his son, who has cats and dogs, which has caused sinus problems for him and his wife, affecting his sleep. He occasionally takes zolpidem  for sleep disturbances.  He is left-handed for writing but uses his right hand for other  activities.       Medications: Outpatient Medications Prior to Visit  Medication Sig   amLODipine  (NORVASC ) 10 MG tablet TAKE 1 TABLET BY MOUTH EVERY DAY   FLUoxetine  (PROZAC ) 20 MG capsule Take 1 capsule (20 mg total) by mouth daily.   Loratadine 10 MG CAPS Take 1 capsule by mouth daily.   tadalafil  (CIALIS ) 5 MG tablet Take 1 tablet (5 mg total) by mouth daily as needed for erectile dysfunction.   [DISCONTINUED] zolpidem  (AMBIEN ) 10 MG tablet Take 0.5-1 tablets (5-10 mg total) by mouth at bedtime as needed for sleep.   cyclobenzaprine  (FLEXERIL ) 10 MG tablet Take 1 tablet (10 mg total) by mouth 3 (three) times daily as needed. May cause drowsiness (Patient not taking: Reported on 04/23/2024)   diclofenac (VOLTAREN) 25 MG EC tablet Take 25 mg by mouth daily as needed. (Patient not taking: Reported on 04/23/2024)   diclofenac sodium (VOLTAREN) 1 % GEL Apply topically 4 (four) times daily. (Patient not taking: Reported on 04/23/2024)   telmisartan  (MICARDIS ) 80 MG tablet TAKE 1 TABLET BY MOUTH EVERY DAY (Patient not taking: Reported on 04/23/2024)   [DISCONTINUED] naproxen  (NAPROSYN ) 500 MG tablet Take 1 tablet (500 mg total) by mouth 2 (two) times daily with a meal. (Patient not taking: Reported on 04/23/2024)   No facility-administered medications prior to visit.   Review of  Systems  Constitutional:  Negative for appetite change, chills and fever.  Respiratory:  Negative for chest tightness, shortness of breath and wheezing.   Cardiovascular:  Negative for chest pain and palpitations.  Gastrointestinal:  Negative for abdominal pain, nausea and vomiting.       Objective    BP (!) 154/71 (BP Location: Left Arm, Patient Position: Sitting, Cuff Size: Normal)   Pulse 87   Ht 5' 10 (1.778 m)   Wt 284 lb (128.8 kg)   SpO2 98%   BMI 40.75 kg/m   Physical Exam  Tender along radial flexor tendons and left lateral foot. Negative phalen's and negative tinel's    Assessment & Plan     1.  Wrist tendonitis (Primary) Recommend wearing wrist brace throughout day for 2-4 weeks.   - naproxen  (NAPROSYN ) 500 MG tablet; Take 1 tablet (500 mg total) by mouth 2 (two) times daily with a meal.  Dispense: 30 tablet; Refill: 0  2. Left foot pain  - DG Foot Complete Left; Future  3. Other insomnia Refill  zolpidem  (AMBIEN ) 10 MG tablet; Take 0.5-1 tablets (5-10 mg total) by mouth at bedtime as needed for sleep.  Dispense: 30 tablet; Refill: 0  4. Need for influenza vaccination   - Flu vaccine trivalent PF, 6mos and older(Flulaval,Afluria,Fluarix,Fluzone)   Nancyann Perry, MD  [facility] [phone] (phone) [phone] (fax)  [facility]

## 2024-04-27 ENCOUNTER — Ambulatory Visit: Payer: Self-pay | Admitting: Family Medicine

## 2024-05-19 ENCOUNTER — Encounter: Payer: Self-pay | Admitting: Family Medicine

## 2024-05-31 ENCOUNTER — Ambulatory Visit (INDEPENDENT_AMBULATORY_CARE_PROVIDER_SITE_OTHER): Admitting: Family Medicine

## 2024-05-31 VITALS — BP 166/93 | HR 65 | Wt 281.5 lb

## 2024-05-31 DIAGNOSIS — I10 Essential (primary) hypertension: Secondary | ICD-10-CM

## 2024-05-31 DIAGNOSIS — N400 Enlarged prostate without lower urinary tract symptoms: Secondary | ICD-10-CM | POA: Diagnosis not present

## 2024-05-31 MED ORDER — TAMSULOSIN HCL 0.4 MG PO CAPS: 0.4000 mg | ORAL_CAPSULE | Freq: Every day | ORAL | 0 refills | Status: AC

## 2024-05-31 MED ORDER — TELMISARTAN-HCTZ 80-25 MG PO TABS: 1.0000 | ORAL_TABLET | Freq: Every day | ORAL | 1 refills | Status: AC

## 2024-05-31 MED ORDER — DUTASTERIDE 0.5 MG PO CAPS: 0.5000 mg | ORAL_CAPSULE | Freq: Every day | ORAL | 0 refills | Status: AC

## 2024-05-31 NOTE — Progress Notes (Signed)
 Established patient visit   Patient: John Duran   DOB: 08/28/1960   63 y.o. Male  MRN: [id number] Visit Date: 05/31/2024  Today's healthcare provider: Nancyann Perry, MD   No chief complaint on file.  Subjective    Discussed the use of AI scribe software for clinical note transcription with the patient, who gave verbal consent to proceed.  History of Present Illness   John Duran is a 63 year old male with hypertension who presents for refill of BPH medications. with elevated blood pressure. He had been prescribed tamsulosin/dutasteride combo in [place], but does not have enough of this medication to get by until he returns to [place] in January  He has been experiencing elevated blood pressure, with readings often in the 150s. He had been monitoring his blood pressure in [place] but lacks a blood pressure cuff while in the [place] . His current medications for blood pressure management include telmisartan  80 mg with hydrochlorothiazide  12.5 mg and clonidine (Catapres) at night. He recalls that his blood pressure was high during a previous visit and that he was experiencing foot pain at the time. He is concerned about his blood pressure management, noting that it has not been consistently controlled, with readings not reaching his target of 120/130 mmHg.  He is managing benign prostatic hyperplasia (BPH) and has run out of his medication, which is a combination of Avodart and Flomax. He suspects he may have left a bottle behind, leading to a shortage. He inquires about the possibility of being prescribed the two generic medications separately, as they are covered by insurance in [place].  He has a family history of aggressive hypertension, as his mother struggles to control her blood pressure.  He travels between [place] and [place] , typically visiting [place] once or twice a year for business.       Medications: Outpatient Medications Prior to  Visit  Medication Sig   FLUoxetine  (PROZAC ) 20 MG capsule Take 1 capsule (20 mg total) by mouth daily.   Loratadine 10 MG CAPS Take 1 capsule by mouth daily.   naproxen  (NAPROSYN ) 500 MG tablet Take 1 tablet (500 mg total) by mouth 2 (two) times daily with a meal.   tadalafil  (CIALIS ) 5 MG tablet Take 1 tablet (5 mg total) by mouth daily as needed for erectile dysfunction.   zolpidem  (AMBIEN ) 10 MG tablet Take 0.5-1 tablets (5-10 mg total) by mouth at bedtime as needed for sleep.   [DISCONTINUED] cyclobenzaprine  (FLEXERIL ) 10 MG tablet Take 1 tablet (10 mg total) by mouth 3 (three) times daily as needed. May cause drowsiness (Patient not taking: Reported on 04/23/2024)   [DISCONTINUED] diclofenac (VOLTAREN) 25 MG EC tablet Take 25 mg by mouth daily as needed. (Patient not taking: Reported on 04/23/2024)   [DISCONTINUED] diclofenac sodium (VOLTAREN) 1 % GEL Apply topically 4 (four) times daily. (Patient not taking: Reported on 04/23/2024)   Telmisartan -hydrochlorothiazide  8-12.50 MG tablet TAKE 1 TABLET BY MOUTH EVERY DAY    No facility-administered medications prior to visit.   Review of Systems  Constitutional:  Negative for appetite change, chills and fever.  Respiratory:  Negative for chest tightness, shortness of breath and wheezing.   Cardiovascular:  Negative for chest pain and palpitations.  Gastrointestinal:  Negative for abdominal pain, nausea and vomiting.       Objective    BP (!) 166/93 (BP Location: Left Arm, Patient Position: Sitting, Cuff Size: Large)   Pulse 65   Wt  281 lb 8 oz (127.7 kg)   SpO2 99%   BMI 40.39 kg/m   Physical Exam   General appearance: Obese male, cooperative and in no acute distress Head: Normocephalic, without obvious abnormality, atraumatic Respiratory: Respirations even and unlabored, normal respiratory rate Extremities: All extremities are intact.  Skin: Skin color, texture, turgor normal. No rashes seen  Psych: Appropriate mood and  affect. Neurologic: Mental status: Alert, oriented to person, place, and time, thought content appropriate.    Assessment & Plan       Essential hypertension Blood pressure remains uncontrolled on current regimen. Discussed increasing telmisartan  dosage and potential addition of amlodipine , which he prefers to avoid due to past side effects. - Increased telmisartan  to 80/25 mg with HCTZ. - Advised home blood pressure monitoring.  - Instructed to report if blood pressure remains in the 150s or 160s after one week on the higher dose. - Sent prescriptions to [facility].  Benign prostatic hyperplasia Current combination medication likely not covered by insurance in the [place] . . Discussed prescribing generic components separately. - Prescribed Avodart and Flomax separately.         Nancyann Perry, MD  [facility] [phone] (phone) [phone] (fax)  [facility]
